# Patient Record
Sex: Male | Born: 1955
Health system: Southern US, Community
[De-identification: ages and names within clinical notes are randomized; demographics above are authoritative.]

## PROBLEM LIST (undated history)

## (undated) DIAGNOSIS — C801 Malignant (primary) neoplasm, unspecified: Secondary | ICD-10-CM

## (undated) DIAGNOSIS — R112 Nausea with vomiting, unspecified: Secondary | ICD-10-CM

## (undated) DIAGNOSIS — I10 Essential (primary) hypertension: Secondary | ICD-10-CM

## (undated) DIAGNOSIS — M5412 Radiculopathy, cervical region: Secondary | ICD-10-CM

## (undated) DIAGNOSIS — Z9889 Other specified postprocedural states: Secondary | ICD-10-CM

## (undated) HISTORY — DX: Radiculopathy, cervical region: M54.12

## (undated) HISTORY — PX: BACK SURGERY: SHX140

## (undated) HISTORY — PX: WRIST SURGERY: SHX841

## (undated) HISTORY — DX: Essential (primary) hypertension: I10

## (undated) HISTORY — PX: HYDROCELE EXCISION: SHX482

---
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
## 2005-03-14 HISTORY — PX: COLONOSCOPY: SHX5424

## 2006-04-14 HISTORY — PX: PROSTATECTOMY: SHX69

## 2013-04-16 ENCOUNTER — Encounter (HOSPITAL_COMMUNITY): Payer: Self-pay | Admitting: Emergency Medicine

## 2013-04-16 ENCOUNTER — Emergency Department (HOSPITAL_COMMUNITY)
Admission: EM | Admit: 2013-04-16 | Discharge: 2013-04-16 | Disposition: A | Discharge status: Home / Self Care | Payer: Medicare Other | Attending: Emergency Medicine | Admitting: Emergency Medicine

## 2013-04-16 ENCOUNTER — Emergency Department (HOSPITAL_COMMUNITY): Payer: Medicare Other

## 2013-04-16 DIAGNOSIS — M5430 Sciatica, unspecified side: Secondary | ICD-10-CM | POA: Insufficient documentation

## 2013-04-16 DIAGNOSIS — Z8546 Personal history of malignant neoplasm of prostate: Secondary | ICD-10-CM | POA: Insufficient documentation

## 2013-04-16 DIAGNOSIS — M5432 Sciatica, left side: Secondary | ICD-10-CM

## 2013-04-16 HISTORY — DX: Malignant (primary) neoplasm, unspecified: C80.1

## 2013-04-16 MED ORDER — PREDNISONE 10 MG PO TABS: ORAL_TABLET | ORAL | Status: DC

## 2013-04-16 MED ORDER — METHOCARBAMOL 500 MG PO TABS: 500.0000 mg | ORAL_TABLET | Freq: Two times a day (BID) | ORAL | Status: DC

## 2013-04-16 MED ORDER — IBUPROFEN 800 MG PO TABS: 800.0000 mg | ORAL_TABLET | Freq: Three times a day (TID) | ORAL | Status: DC

## 2013-04-16 NOTE — ED Notes (Signed)
Pt c/o left side back pain that starts in left buttock area and radiates down left leg at times, reports that pain started after he was working at his wood pile and jumped across a ditch, denies any problems with urination or bowel movements,

## 2013-04-16 NOTE — ED Notes (Signed)
Pt c/o left hip/buttox pain radiating down leg since chopping/lifting wood on Monday

## 2013-04-16 NOTE — ED Notes (Signed)
Santiago Glad PA at bedside speaking with pt and family

## 2013-04-16 NOTE — ED Provider Notes (Signed)
CSN: [id number]     Arrival date & time 04/16/13  1007 History   First MD Initiated Contact with Patient 04/16/13 1009     Chief Complaint  Patient presents with  . Hip Pain   (Consider location/radiation/quality/duration/timing/severity/associated sxs/prior Treatment) Patient is a 57 y.o. male presenting with back pain. The history is provided by the patient. No language interpreter was used.  Back Pain Location:  Lumbar spine Quality:  Aching Radiates to:  Does not radiate Pain severity:  Moderate Pain is:  Same all the time Duration:  2 days Timing:  Constant Progression:  Worsening Chronicity:  New Relieved by:  Nothing Worsened by:  Nothing tried Ineffective treatments:  None tried Associated symptoms: no abdominal pain     Past Medical History  Diagnosis Date  . Cancer     prostate   Past Surgical History  Procedure Laterality Date  . Back surgery    . Wrist surgery     No family history on file. History  Substance Use Topics  . Smoking status: Never Smoker   . Smokeless tobacco: Not on file  . Alcohol Use: Yes     Comment: occasional    Review of Systems  Gastrointestinal: Negative for abdominal pain.  Musculoskeletal: Positive for back pain.  All other systems reviewed and are negative.    Allergies  Review of patient's allergies indicates no known allergies.  Home Medications  No current outpatient prescriptions on file. BP 153/89  Pulse 58  Temp(Src) 98 F (36.7 C) (Oral)  Resp 16  Ht 5\' 11"  (1.803 m)  Wt 160 lb (72.576 kg)  BMI 22.33 kg/m2  SpO2 100% Physical Exam  Nursing note and vitals reviewed. Constitutional: He is oriented to person, place, and time. He appears well-developed and well-nourished.  HENT:  Head: Normocephalic.  Eyes: EOM are normal. Pupils are equal, round, and reactive to light.  Neck: Normal range of motion.  Cardiovascular: Normal rate.   Pulmonary/Chest: Effort normal.  Abdominal: Soft. He exhibits no  distension.  Musculoskeletal: Normal range of motion. He exhibits tenderness.  Tender sciatic notch  Neurological: He is alert and oriented to person, place, and time.  Skin: Skin is warm.  Psychiatric: He has a normal mood and affect.    ED Course  Procedures (including critical care time) Labs Review Labs Reviewed - No data to display Imaging Review No results found.  EKG Interpretation   None       MDM   1. Sciatica, left    Prednisone taper,  Robaxin, ibuprofen.   Follow up with Dr. Aline Brochure for evaluation of back pain   Fransico Meadow, PA-C 04/16/13 1127

## 2013-04-16 NOTE — Discharge Instructions (Signed)
Back Pain, Adult Back pain is very common. The pain often gets better over time. The cause of back pain is usually not dangerous. Most people can learn to manage their back pain on their own.  HOME CARE   Stay active. Start with short walks on flat ground if you can. Try to walk farther each day.  Do not sit, drive, or stand in one place for more than 30 minutes. Do not stay in bed.  Do not avoid exercise or work. Activity can help your back heal faster.  Be careful when you bend or lift an object. Bend at your knees, keep the object close to you, and do not twist.  Sleep on a firm mattress. Lie on your side, and bend your knees. If you lie on your back, put a pillow under your knees.  Only take medicines as told by your doctor.  Put ice on the injured area.  Put ice in a plastic bag.  Place a towel between your skin and the bag.  Leave the ice on for 15-20 minutes, 03-04 times a day for the first 2 to 3 days. After that, you can switch between ice and heat packs.  Ask your doctor about back exercises or massage.  Avoid feeling anxious or stressed. Find good ways to deal with stress, such as exercise. GET HELP RIGHT AWAY IF:   Your pain does not go away with rest or medicine.  Your pain does not go away in 1 week.  You have new problems.  You do not feel well.  The pain spreads into your legs.  You cannot control when you poop (bowel movement) or pee (urinate).  Your arms or legs feel weak or lose feeling (numbness).  You feel sick to your stomach (nauseous) or throw up (vomit).  You have belly (abdominal) pain.  You feel like you may pass out (faint). MAKE SURE YOU:   Understand these instructions.  Will watch your condition.  Will get help right away if you are not doing well or get worse. Document Released: 09/17/2007 Document Revised: 06/23/2011 Document Reviewed: 08/19/2010 [facility] Patient Information 2014 [place]. Sciatica Sciatica is pain,  weakness, numbness, or tingling along the path of the sciatic nerve. The nerve starts in the lower back and runs down the back of each leg. The nerve controls the muscles in the lower leg and in the back of the knee, while also providing sensation to the back of the thigh, lower leg, and the sole of your foot. Sciatica is a symptom of another medical condition. For instance, nerve damage or certain conditions, such as a herniated disk or bone spur on the spine, pinch or put pressure on the sciatic nerve. This causes the pain, weakness, or other sensations normally associated with sciatica. Generally, sciatica only affects one side of the body. CAUSES   Herniated or slipped disc.  Degenerative disk disease.  A pain disorder involving the narrow muscle in the buttocks (piriformis syndrome).  Pelvic injury or fracture.  Pregnancy.  Tumor (rare). SYMPTOMS  Symptoms can vary from mild to very severe. The symptoms usually travel from the low back to the buttocks and down the back of the leg. Symptoms can include:  Mild tingling or dull aches in the lower back, leg, or hip.  Numbness in the back of the calf or sole of the foot.  Burning sensations in the lower back, leg, or hip.  Sharp pains in the lower back, leg, or hip.  Leg weakness.  Severe back pain inhibiting movement. These symptoms may get worse with coughing, sneezing, laughing, or prolonged sitting or standing. Also, being overweight may worsen symptoms. DIAGNOSIS  Your caregiver will perform a physical exam to look for common symptoms of sciatica. He or she may ask you to do certain movements or activities that would trigger sciatic nerve pain. Other tests may be performed to find the cause of the sciatica. These may include:  Blood tests.  X-rays.  Imaging tests, such as an MRI or CT scan. TREATMENT  Treatment is directed at the cause of the sciatic pain. Sometimes, treatment is not necessary and the pain and discomfort  goes away on its own. If treatment is needed, your caregiver may suggest:  Over-the-counter medicines to relieve pain.  Prescription medicines, such as anti-inflammatory medicine, muscle relaxants, or narcotics.  Applying heat or ice to the painful area.  Steroid injections to lessen pain, irritation, and inflammation around the nerve.  Reducing activity during periods of pain.  Exercising and stretching to strengthen your abdomen and improve flexibility of your spine. Your caregiver may suggest losing weight if the extra weight makes the back pain worse.  Physical therapy.  Surgery to eliminate what is pressing or pinching the nerve, such as a bone spur or part of a herniated disk. HOME CARE INSTRUCTIONS   Only take over-the-counter or prescription medicines for pain or discomfort as directed by your caregiver.  Apply ice to the affected area for 20 minutes, 3 4 times a day for the first 48 72 hours. Then try heat in the same way.  Exercise, stretch, or perform your usual activities if these do not aggravate your pain.  Attend physical therapy sessions as directed by your caregiver.  Keep all follow-up appointments as directed by your caregiver.  Do not wear high heels or shoes that do not provide proper support.  Check your mattress to see if it is too soft. A firm mattress may lessen your pain and discomfort. SEEK IMMEDIATE MEDICAL CARE IF:   You lose control of your bowel or bladder (incontinence).  You have increasing weakness in the lower back, pelvis, buttocks, or legs.  You have redness or swelling of your back.  You have a burning sensation when you urinate.  You have pain that gets worse when you lie down or awakens you at night.  Your pain is worse than you have experienced in the past.  Your pain is lasting longer than 4 weeks.  You are suddenly losing weight without reason. MAKE SURE YOU:  Understand these instructions.  Will watch your  condition.  Will get help right away if you are not doing well or get worse. Document Released: 03/25/2001 Document Revised: 09/30/2011 Document Reviewed: 08/10/2011 [facility] Patient Information 2014 [place].

## 2013-04-16 NOTE — ED Provider Notes (Signed)
Medical screening examination/treatment/procedure(s) were performed by non-physician practitioner and as supervising physician I was immediately available for consultation/collaboration.  Richarda Blade, MD 04/16/13 [phone]

## 2013-04-19 ENCOUNTER — Telehealth: Payer: Self-pay | Admitting: Orthopedic Surgery

## 2013-04-19 NOTE — Telephone Encounter (Signed)
04/19/13 called back to patient, reached, relayed, then lost connection in midst of scheduling appointment. Trying back.

## 2013-04-19 NOTE — Telephone Encounter (Signed)
Call received from patient, requests appointment following [facility] visit for problem, sciatica-chart visit at Emergency Room indicates referral to our office;  had been seen there 04/16/13, had Xrays, and was prescribed steroid and muscle relaxer, which states is helping a little.  States has history of back surgery, performed in [place].  Please review and advise. Ph# [phone]. (Patient's insurance is [employer])

## 2013-04-19 NOTE — Telephone Encounter (Signed)
6 weeks if not better

## 2013-04-21 NOTE — Telephone Encounter (Signed)
Called back to patient to follow up; states he has appointment scheduled with primary care on 05/06/13, and will "go from there" .  Elects to hold at this time on scheduling a 6-week appointment here.

## 2013-05-06 ENCOUNTER — Encounter: Payer: Self-pay | Admitting: Family Medicine

## 2013-05-06 ENCOUNTER — Ambulatory Visit (INDEPENDENT_AMBULATORY_CARE_PROVIDER_SITE_OTHER): Payer: Medicare Other | Admitting: Family Medicine

## 2013-05-06 VITALS — BP 126/84 | HR 60 | Temp 97.6°F | Resp 20 | Ht 69.5 in | Wt 153.4 lb

## 2013-05-06 DIAGNOSIS — H9190 Unspecified hearing loss, unspecified ear: Secondary | ICD-10-CM

## 2013-05-06 DIAGNOSIS — M1990 Unspecified osteoarthritis, unspecified site: Secondary | ICD-10-CM

## 2013-05-06 DIAGNOSIS — C801 Malignant (primary) neoplasm, unspecified: Secondary | ICD-10-CM

## 2013-05-06 DIAGNOSIS — Z0000 Encounter for general adult medical examination without abnormal findings: Secondary | ICD-10-CM

## 2013-05-06 DIAGNOSIS — Z7289 Other problems related to lifestyle: Secondary | ICD-10-CM

## 2013-05-06 DIAGNOSIS — Z789 Other specified health status: Secondary | ICD-10-CM

## 2013-05-06 DIAGNOSIS — R110 Nausea: Secondary | ICD-10-CM

## 2013-05-06 DIAGNOSIS — N529 Male erectile dysfunction, unspecified: Secondary | ICD-10-CM

## 2013-05-06 DIAGNOSIS — F1290 Cannabis use, unspecified, uncomplicated: Secondary | ICD-10-CM

## 2013-05-06 DIAGNOSIS — M5412 Radiculopathy, cervical region: Secondary | ICD-10-CM

## 2013-05-06 DIAGNOSIS — R634 Abnormal weight loss: Secondary | ICD-10-CM

## 2013-05-06 DIAGNOSIS — F1210 Cannabis abuse, uncomplicated: Secondary | ICD-10-CM

## 2013-05-06 DIAGNOSIS — Z23 Encounter for immunization: Secondary | ICD-10-CM

## 2013-05-06 LAB — POCT URINALYSIS DIPSTICK
Bilirubin, UA: 6
Blood, UA: NEGATIVE
GLUCOSE UA: NEGATIVE
Leukocytes, UA: NEGATIVE
Nitrite, UA: NEGATIVE
Urobilinogen, UA: NEGATIVE
pH, UA: 6

## 2013-05-06 NOTE — Progress Notes (Signed)
   Subjective:    Patient ID: Edward Rhodes, male    DOB: [birth year], 58 y.o.   MRN: [id number]  HPI Pt here to establish care. Recently moved from [place] with his wife. Are now closer to grandchild.  Sadly lost son last April to narcotic addiction - extremely distressing to pt.   Reviewed records. Fairly healthy gentleman, retired Therapist, art. Likes to stay busy. No specific concerns today.  H/o cervical radiculopathy was seeing ortho in [place]. Would like referral to continue seeing ortho.   Heath maint: Has not established yet with dentist or optometry - would like refereals. Due for colonoscopy. - has not had in the past. Has not had tdap. Used to smoke tobacco and currently smokes MJ. Discussed risk for AAA. Does not drink much milk but does stay outdoors gardening and sliptting Edward Rhodes a lot. Has not had D checked recently.  Born in [place], has not checked hep C yet.  Not sure when last lipids checked.  BP at goal.   Has problem with ED - attracted to wife and enjoys sexual intimacy but difficulty achieving erection. Not on nitrates.     Review of Systems A 12 point review of systems is negative except as per hpi.       Objective:   Physical Exam  Nursing note and vitals reviewed. Constitutional: He is oriented to person, place, and time. He  appears well-developed and well-nourished.  HENT:  Right Ear: External ear normal.  Left Ear: External ear normal.  Nose: Nose normal.  Mouth/Throat: Oropharynx is clear and moist. No oropharyngeal exudate.  Eyes: Conjunctivae are normal. Pupils are equal, round, and reactive to light.  Neck: Normal range of motion. Neck supple. No thyromegaly present.  Cardiovascular: Normal rate, regular rhythm and normal heart sounds.   Pulmonary/Chest: Effort normal and breath sounds normal.  Abdominal: Soft. Bowel sounds are normal.  no distension. There is no tenderness. There is no rebound.  Lymphadenopathy:    He has no cervical adenopathy.    Neurological: He is alert and oriented to person, place, and time. He has normal reflexes.  Skin: Skin is warm and dry.He has no concerning moles or skin lesions Psychiatric: He has a normal mood and affect. His behavior is normal. Tearful when talkoing of son       Assessment & Plan:  Edward Rhodes was seen today for new patient.  Diagnoses and associated orders for this visit:  Preventative health care - Ambulatory referral to Dentistry - Ambulatory referral to Optometry - Ambulatory referral to Gastroenterology - Tdap vaccine greater than or equal to 7yo IM - US Aorta; Future - Vit D  25 hydroxy (rtn osteoporosis monitoring); Future - POCT urinalysis dipstick - Lipid Panel; Future - PSA; Future - Hepatitis C antibody; Future  Cervical radiculopathy - Ambulatory referral to Orthopedic Surgery  Cancer - Ambulatory referral to Urology - PSA; Future  Marijuana smoker - advised to cut down Osteoarthritis  Alcohol consumption of one to four drinks per day  Decreased hearing - Ambulatory referral to Audiology  Nausea alone  Loss of weight - Comprehensive metabolic panel; Future - CBC with Differential; Future - TSH; Future  Erectile dysfunction - Testosterone; Future - sample cialis given  rtc 2 weeks review labs and f/u

## 2013-05-06 NOTE — Patient Instructions (Signed)

## 2013-05-10 ENCOUNTER — Other Ambulatory Visit: Payer: Self-pay | Admitting: Family Medicine

## 2013-05-10 LAB — COMPREHENSIVE METABOLIC PANEL
ALBUMIN: 4.4 g/dL (ref 3.5–5.2)
ALT: 8 U/L (ref 0–53)
AST: 14 U/L (ref 0–37)
Alkaline Phosphatase: 46 U/L (ref 39–117)
BUN: 18 mg/dL (ref 6–23)
CHLORIDE: 102 meq/L (ref 96–112)
CO2: 29 meq/L (ref 19–32)
CREATININE: 0.91 mg/dL (ref 0.50–1.35)
Calcium: 9.5 mg/dL (ref 8.4–10.5)
Glucose, Bld: 102 mg/dL — ABNORMAL HIGH (ref 70–99)
POTASSIUM: 4.9 meq/L (ref 3.5–5.3)
Sodium: 137 mEq/L (ref 135–145)
TOTAL PROTEIN: 6.4 g/dL (ref 6.0–8.3)
Total Bilirubin: 0.6 mg/dL (ref 0.3–1.2)

## 2013-05-10 LAB — CBC WITH DIFFERENTIAL/PLATELET
Basophils Absolute: 0 10*3/uL (ref 0.0–0.1)
Basophils Relative: 0 % (ref 0–1)
Eosinophils Absolute: 0.2 10*3/uL (ref 0.0–0.7)
Eosinophils Relative: 4 % (ref 0–5)
HCT: 44.5 % (ref 39.0–52.0)
Hemoglobin: 15.1 g/dL (ref 13.0–17.0)
LYMPHS ABS: 1.3 10*3/uL (ref 0.7–4.0)
LYMPHS PCT: 27 % (ref 12–46)
MCH: 32.4 pg (ref 26.0–34.0)
MCHC: 33.9 g/dL (ref 30.0–36.0)
MCV: 95.5 fL (ref 78.0–100.0)
Monocytes Absolute: 0.5 10*3/uL (ref 0.1–1.0)
Monocytes Relative: 10 % (ref 3–12)
NEUTROS PCT: 59 % (ref 43–77)
Neutro Abs: 2.8 10*3/uL (ref 1.7–7.7)
Platelets: 200 10*3/uL (ref 150–400)
RBC: 4.66 MIL/uL (ref 4.22–5.81)
RDW: 13.1 % (ref 11.5–15.5)
WBC: 4.7 10*3/uL (ref 4.0–10.5)

## 2013-05-10 LAB — LIPID PANEL
CHOL/HDL RATIO: 3.7 ratio
Cholesterol: 182 mg/dL (ref 0–200)
HDL: 49 mg/dL (ref >39)
LDL Cholesterol: 117 mg/dL — ABNORMAL HIGH (ref 0–99)
Triglycerides: 81 mg/dL (ref <150)
VLDL: 16 mg/dL (ref 0–40)

## 2013-05-10 LAB — TSH: TSH: 1.301 u[IU]/mL (ref 0.350–4.500)

## 2013-05-11 LAB — PSA: PSA: <0.01 ng/mL (ref <=4.00)

## 2013-05-11 LAB — VITAMIN D 25 HYDROXY (VIT D DEFICIENCY, FRACTURES): Vit D, 25-Hydroxy: 33 ng/mL (ref 30–89)

## 2013-05-11 LAB — TESTOSTERONE: Testosterone: 563 ng/dL (ref 300–890)

## 2013-05-11 LAB — HEPATITIS C ANTIBODY: HCV AB: NEGATIVE

## 2013-05-26 ENCOUNTER — Ambulatory Visit (HOSPITAL_COMMUNITY)
Admission: RE | Admit: 2013-05-26 | Discharge: 2013-05-26 | Disposition: A | Discharge status: Home / Self Care | Payer: Medicare Other | Source: Ambulatory Visit | Attending: Family Medicine | Admitting: Family Medicine

## 2013-05-26 DIAGNOSIS — Z1389 Encounter for screening for other disorder: Secondary | ICD-10-CM | POA: Insufficient documentation

## 2013-05-26 DIAGNOSIS — I77819 Aortic ectasia, unspecified site: Secondary | ICD-10-CM | POA: Insufficient documentation

## 2013-05-26 DIAGNOSIS — Z0000 Encounter for general adult medical examination without abnormal findings: Secondary | ICD-10-CM

## 2013-05-30 ENCOUNTER — Telehealth: Payer: Self-pay | Admitting: *Deleted

## 2013-05-30 NOTE — Progress Notes (Signed)
See telephone encounters

## 2013-05-30 NOTE — Telephone Encounter (Signed)
Pt called, no answer, message left for callback

## 2013-05-30 NOTE — Telephone Encounter (Signed)
Message copied by [facility], Louis Meckel on Mon May 30, 2013 11:23 AM ------      Message from: Doran Heater      Created: Fri May 27, 2013  8:33 AM       Please let pt know that he is at risk for developing AAA. He should get a repeat US in 5 years. Thanks AW ------

## 2013-05-30 NOTE — Telephone Encounter (Signed)
Pt returned call and notified to have US done again in 5 years. Pt appreciative and understanding.

## 2013-06-06 ENCOUNTER — Ambulatory Visit (INDEPENDENT_AMBULATORY_CARE_PROVIDER_SITE_OTHER): Payer: Medicare Other | Admitting: Family Medicine

## 2013-06-06 ENCOUNTER — Encounter: Payer: Self-pay | Admitting: Family Medicine

## 2013-06-06 VITALS — BP 130/88 | HR 60 | Temp 98.0°F | Resp 18 | Ht 69.5 in | Wt 159.0 lb

## 2013-06-06 DIAGNOSIS — F1290 Cannabis use, unspecified, uncomplicated: Secondary | ICD-10-CM

## 2013-06-06 DIAGNOSIS — Z789 Other specified health status: Secondary | ICD-10-CM

## 2013-06-06 DIAGNOSIS — F1210 Cannabis abuse, uncomplicated: Secondary | ICD-10-CM

## 2013-06-06 DIAGNOSIS — N529 Male erectile dysfunction, unspecified: Secondary | ICD-10-CM

## 2013-06-06 DIAGNOSIS — R634 Abnormal weight loss: Secondary | ICD-10-CM

## 2013-06-06 DIAGNOSIS — I77811 Abdominal aortic ectasia: Secondary | ICD-10-CM

## 2013-06-06 DIAGNOSIS — Z7289 Other problems related to lifestyle: Secondary | ICD-10-CM

## 2013-06-06 DIAGNOSIS — F329 Major depressive disorder, single episode, unspecified: Secondary | ICD-10-CM

## 2013-06-06 DIAGNOSIS — F4329 Adjustment disorder with other symptoms: Secondary | ICD-10-CM | POA: Insufficient documentation

## 2013-06-06 DIAGNOSIS — F4381 Prolonged grief disorder: Secondary | ICD-10-CM

## 2013-06-06 MED ORDER — TADALAFIL 10 MG PO TABS: 10.0000 mg | ORAL_TABLET | Freq: Every day | ORAL | Status: DC | PRN

## 2013-06-06 NOTE — Progress Notes (Signed)
   Subjective:    Patient ID: Edward Rhodes, male    DOB: Apr 11, 1956, 58 y.o.   MRN: [id number]  HPI Pt here to f/u.   CPE last visit - labs all look good. AAA screen shows: Ectatic proximal abdominal aorta at risk for aneurysm development.  Recommend follow up by [place] in 5 years. Referrals have been placed but not yet scheduled.   nusea has resolved  Weight loss has stabilized - likley related to nausea.   ED much improved with cialis.   He continues to smoke MJ daily. Drinks much less than in the past - 1 glass wine a few times weekly. Lost his son to narcotics last April - still absolutely devastated. Has not sought counselling and declined meds. Sees as sign of weakness. Says is very in love with his wife but is much more frustrated with her over the past months. She has gained weight due to depression whereas he has been on the go nonstop.    Review of Systems A 12 point review of systems is negative except as per hpi.       Objective:   Physical Exam Nursing note and vitals reviewed. Constitutional: He is oriented to person, place, and time. He  appears well-developed and well-nourished.  HENT:  Right Ear: External ear normal.  Left Ear: External ear normal.  Nose: Nose normal.  Mouth/Throat: Oropharynx is clear and moist. No oropharyngeal exudate.  Eyes: Conjunctivae are normal. Pupils are equal, round, and reactive to light.  Neck: Normal range of motion. Neck supple. No thyromegaly present.  Cardiovascular: Normal rate, regular rhythm and normal heart sounds.   Pulmonary/Chest: Effort normal and breath sounds normal.  Abdominal: Soft. Bowel sounds are normal.  no distension. There is no tenderness. There is no rebound.  Lymphadenopathy:    He has no cervical adenopathy.  Neurological: He is alert and oriented to person, place, and time. He has normal reflexes.  Skin: Skin is warm and dry.He has no concerning moles or skin lesions Psychiatric: He has a normal mood and  affect. His behavior is normal.        Assessment & Plan:  [place] was seen today for follow-up.  Diagnoses and associated orders for this visit:  Loss of weight - will keep an eye - stable for now Marijuana smoker - agrees to decrease to weekend use only. Will not watch grandchild or drive under the influence.  Alcohol consumption of one to four drinks per day Would like him to stop altogether, but 3 glasses/week is big iprovement - do not increase.  Prolonged grief reaction - Ambulatory referral to Psychology - he and wife have agreed to couples counselling.  - if feels things are getting out of hand, or would like to try meds, woiuld consider welbutrin. Pt very concerned about sexual side effects.  - no si/hi now, will go to ED if develop. Erectile dysfunction - tadalafil (CIALIS) 10 MG tablet; Take 1 tablet (10 mg total) by mouth daily as needed for erectile dysfunction. - continue prn abd aortic ectasia - f/u u/s 5 yrs, earlier if any sx. F/u 3-6 mos, earlir if needed. Call with any questions or concerns.

## 2013-06-06 NOTE — Patient Instructions (Signed)
You do not have a AAA now, but this is what we are watching for because you are at significantly increased risk.   Abdominal Aortic Aneurysm An aneurysm is a weakened or damaged part of an artery wall that bulges from the normal force of blood pumping through the body. An abdominal aortic aneurysm is an aneurysm that occurs in the lower part of the aorta, the main artery of the body.  The major concern with an abdominal aortic aneurysm is that it can enlarge and burst (rupture) or blood can flow between the layers of the wall of the aorta through a tear (aorticdissection). Both of these conditions can cause bleeding inside the body and can be life threatening unless diagnosed and treated promptly. CAUSES  The exact cause of an abdominal aortic aneurysm is unknown. Some contributing factors are:   A hardening of the arteries caused by the buildup of fat and other substances in the lining of a blood vessel (arteriosclerosis).  Inflammation of the walls of an artery (arteritis).   Connective tissue diseases, such as Marfan syndrome.   Abdominal trauma.   An infection, such as syphilis or staphylococcus, in the wall of the aorta (infectious aortitis) caused by bacteria. RISK FACTORS  Risk factors that contribute to an abdominal aortic aneurysm may include:  Age older than 50 years.   High blood pressure (hypertension).  Male gender.  Ethnicity (white race).  Obesity.  Family history of aneurysm (first degree relatives only).  Tobacco use. PREVENTION  The following healthy lifestyle habits may help decrease your risk of abdominal aortic aneurysm:  Quitting smoking. Smoking can raise your blood pressure and cause arteriosclerosis.  Limiting or avoiding alcohol.  Keeping your blood pressure, blood sugar level, and cholesterol levels within normal limits.  Decreasing your salt intake. In somepeople, too much salt can raise blood pressure and increase your risk of abdominal  aortic aneurysm.  Eating a diet low in saturated fats and cholesterol.  Increasing your fiber intake by including whole grains, vegetables, and fruits in your diet. Eating these foods may help lower blood pressure.  Maintaining a healthy weight.  Staying physically active and exercising regularly. SYMPTOMS  The symptoms of abdominal aortic aneurysm may vary depending on the size and rate of growth of the aneurysm.Most grow slowly and do not have any symptoms. When symptoms do occur, they may include:  Pain (abdomen, side, lower back, or groin). The pain may vary in intensity. A sudden onset of severe pain may indicate that the aneurysm has ruptured.  Feeling full after eating only small amounts of food.  Nausea or vomiting or both.  Feeling a pulsating lump in the abdomen.  Feeling faint or passing out. DIAGNOSIS  Since most unruptured abdominal aortic aneurysms have no symptoms, they are often discovered during diagnostic exams for other conditions. An aneurysm may be found during the following procedures:  Ultrasonography (A one-time screening for abdominal aortic aneurysm by ultrasonography is also recommended for all men aged 66-75 years who have ever smoked).  X-ray exams.  A computed tomography (CT).  Magnetic resonance imaging (MRI).  Angiography or arteriography. TREATMENT  Treatment of an abdominal aortic aneurysm depends on the size of your aneurysm, your age, and risk factors for rupture. Medication to control blood pressure and pain may be used to manage aneurysms smaller than 6 cm. Regular monitoring for enlargement may be recommended by your caregiver if:  The aneurysm is 3 4 cm in size (an annual ultrasonography may be recommended).  The aneurysm is 4 4.5 cm in size (an ultrasonography every 6 months may be recommended).  The aneurysm is larger than 4.5 cm in size (your caregiver may ask that you be examined by a vascular surgeon). If your aneurysm is larger  than 6 cm, surgical repair may be recommended. There are two main methods for repair of an aneurysm:   Endovascular repair (a minimally invasive surgery). This is done most often.  Open repair. This method is used if an endovascular repair is not possible. Document Released: 01/08/2005 Document Revised: 07/26/2012 Document Reviewed: 04/30/2012 [facility] Patient Information 2014 [place], [place].

## 2013-06-06 NOTE — Assessment & Plan Note (Signed)
Needs u/s in 2020, earlier if sx develop

## 2013-06-08 ENCOUNTER — Encounter: Payer: Self-pay | Admitting: Family Medicine

## 2013-06-29 ENCOUNTER — Telehealth: Payer: Self-pay | Admitting: *Deleted

## 2013-06-29 NOTE — Telephone Encounter (Signed)
Wife in office and requested print out of lab results from last visit. Labs printed and given to wife

## 2013-08-17 ENCOUNTER — Ambulatory Visit (INDEPENDENT_AMBULATORY_CARE_PROVIDER_SITE_OTHER): Payer: Medicare Other | Admitting: Gastroenterology

## 2013-08-17 ENCOUNTER — Encounter (INDEPENDENT_AMBULATORY_CARE_PROVIDER_SITE_OTHER): Payer: Self-pay

## 2013-08-17 ENCOUNTER — Encounter: Payer: Self-pay | Admitting: Gastroenterology

## 2013-08-17 VITALS — BP 135/82 | HR 53 | Temp 97.6°F | Ht 68.0 in | Wt 158.2 lb

## 2013-08-17 DIAGNOSIS — Z1211 Encounter for screening for malignant neoplasm of colon: Secondary | ICD-10-CM

## 2013-08-17 NOTE — Progress Notes (Signed)
Referring Provider: No ref. provider found Primary Care Physician:  Doran Heater, MD Primary Gastroenterologist:  Dr. Oneida Alar   Chief Complaint  Patient presents with  . Colonoscopy    HPI:   Edward Rhodes presents today to set up a routine screening colonoscopy. Original colonoscopy about 7-8 years ago in [place] per patient. No rectal bleeding. No changes in bowel habits. Like clockwork every morning. Denies abdominal pain. No unexplained weight loss or lack of appetite. No reflux, dysphagia.   Son passed away about a year ago. Moved down to be with daughter and new grandbaby, who is turning a year old May 13.   Past Medical History  Diagnosis Date  . Cancer     prostate  . Cervical radiculopathy     Past Surgical History  Procedure Laterality Date  . Back surgery    . Wrist surgery    . Prostatectomy  07/28/06  . Hydrocele excision    . Colonoscopy      up state [place]    No current outpatient prescriptions on file.   No current facility-administered medications for this visit.    Allergies as of 08/17/2013 - Review Complete 08/17/2013  Allergen Reaction Noted  . Aleve [naproxen sodium]  04/16/2013    Family History  Problem Relation Age of Onset  . Heart disease Father   . Dementia Father   . Prostate cancer Father   . Drug abuse Son   . Dementia Mother   . Breast cancer Sister   . Colon cancer Neg Hx     History   Social History  . Marital Status: Married    Spouse Name: N/A    Number of Children: N/A  . Years of Education: N/A   Occupational History  . Not on file.   Social History Main Topics  . Smoking status: Never Smoker   . Smokeless tobacco: Not on file  . Alcohol Use: Yes     Comment: 2-3 glasses wine once per week, h/o heavy drinking 25 years ago  . Drug Use: Yes    Special: Marijuana     Comment: couple times a week   . Sexual Activity: Not on file   Other Topics Concern  . Not on file   Social History  Narrative   Son passed away 07-27-2012 from narcotic addiction. Started when was bitten by snake and required narcotic pain meds.     Review of Systems: As mentioned in HPI.   Physical Exam: BP 135/82  Pulse 53  Temp(Src) 97.6 F (36.4 C) (Oral)  Ht 5\' 8"  (1.727 m)  Wt 158 lb 3.2 oz (71.759 kg)  BMI 24.06 kg/m2 General:   Alert and oriented. Well-developed, well-nourished, pleasant and cooperative. Head:  Normocephalic and atraumatic. Eyes:  Conjunctiva pink, sclera clear, no icterus.   Conjunctiva pink. Ears:  Normal auditory acuity. Nose:  No deformity, discharge,  or lesions. Mouth:  No deformity or lesions, mucosa pink and moist.  Lungs:  Clear to auscultation bilaterally, without wheezing, rales, or rhonchi.  Heart:  S1, S2 present without murmurs noted.  Abdomen:  +BS, soft, non-tender and non-distended. Without mass or HSM. No rebound or guarding. No hernias noted. Rectal:  Deferred  Msk:  Symmetrical without gross deformities. Normal posture. Extremities:  Without clubbing or edema. Neurologic:  Alert and  oriented x4;  grossly normal neurologically. Skin:  Intact, warm and dry without significant lesions or rashes Psych:  Alert and cooperative.  Normal mood and affect.  Lab Results  Component Value Date   WBC 4.7 05/10/2013   HGB 15.1 05/10/2013   HCT 44.5 05/10/2013   MCV 95.5 05/10/2013   PLT 200 05/10/2013   Lab Results  Component Value Date   ALT 8 05/10/2013   AST 14 05/10/2013   ALKPHOS 46 05/10/2013   BILITOT 0.6 05/10/2013

## 2013-08-17 NOTE — Patient Instructions (Signed)
Please complete the stool sample and return to our office. If it is positive, we will definitely proceed with a colonoscopy.  However, if it is negative, and you are not due for another colonoscopy, we will wait until it has been 10 years since your last procedure.  Further recommendations to follow!

## 2013-08-17 NOTE — Assessment & Plan Note (Signed)
58 year old male with last colonoscopy in remote past, unsure date. No concerning lower GI symptoms, rectal bleeding, change in bowel habits. Will obtain outside records from [place]; if it has been 10 years, he will need a routine screening. I have also asked him to complete an ifobt; if this is positive, will proceed with colonoscopy regardless of time lapse. Will need Propofol for sedation due to history of ETOH use and marijuana.

## 2013-08-18 NOTE — Progress Notes (Signed)
No PCP 

## 2013-08-19 ENCOUNTER — Encounter: Payer: Self-pay | Admitting: Gastroenterology

## 2013-08-19 NOTE — Progress Notes (Signed)
Received records from [place]. Patient's last colonoscopy was in Dec 2006 WITHOUT evidence of polyps. 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   Without a family history of colon cancer or polyps, he will be due for routine screening in 2016.   However, he has been asked to complete an ifobt for [place]. IF POSITIVE, we will proceed with colonoscopy now. IF NEGATIVE, colonoscopy next year. Please let patient know.

## 2013-09-07 NOTE — Progress Notes (Signed)
Reminder in epic °

## 2013-09-07 NOTE — Progress Notes (Signed)
Noted. Please make sure he is on recall list for 2016.

## 2013-09-07 NOTE — Progress Notes (Signed)
Will forward to Manuela Schwartz to nic the next colonoscopy.

## 2013-09-07 NOTE — Progress Notes (Signed)
Called and told pt. He said he was wrong when he told the doctor that he had blood in his stool, that his wife said that it was in his urine and he has been seen and taken care of.  He does not want to do the iFOBT now and said he just wants to wait til 2016 when he is next due for the colonoscopy.

## 2014-09-13 ENCOUNTER — Encounter: Payer: Self-pay | Admitting: Gastroenterology

## 2014-09-13 ENCOUNTER — Encounter (INDEPENDENT_AMBULATORY_CARE_PROVIDER_SITE_OTHER): Payer: Self-pay

## 2014-09-13 ENCOUNTER — Other Ambulatory Visit: Payer: Self-pay

## 2014-09-13 ENCOUNTER — Ambulatory Visit (INDEPENDENT_AMBULATORY_CARE_PROVIDER_SITE_OTHER): Payer: Medicare Other | Admitting: Gastroenterology

## 2014-09-13 VITALS — BP 147/86 | HR 56 | Temp 97.6°F | Ht 70.0 in | Wt 156.6 lb

## 2014-09-13 DIAGNOSIS — Z1211 Encounter for screening for malignant neoplasm of colon: Secondary | ICD-10-CM

## 2014-09-13 MED ORDER — NA SULFATE-K SULFATE-MG SULF 17.5-3.13-1.6 GM/177ML PO SOLN: 1.0000 | Freq: Once | ORAL | Status: DC

## 2014-09-13 NOTE — Assessment & Plan Note (Addendum)
AVERAGE RISK  TCS JUN 2016 AT 0830 PER PT REQUEST-SUPREP. DISCUSSED PROCEDURE, BENEFITS, & RISKS. DISCUSSED NEED FOR SCREENING FOR BARRETT'S ESOPHAGUS, BENEFITS OF HAVING EGD AND RISK OF NOT HAVING EGD INCLUDING ADVANCED ESOPHAGEAL CANCER, AND MANAGEMENT OF BARRETT'S ESOPHAGUS. PT DECLINES EGD AT THIS TIME. OPV PRN

## 2014-09-13 NOTE — Progress Notes (Signed)
   Subjective:    Patient ID: Edward Rhodes, male    DOB: 07/05/1955, 59 y.o.   MRN: [id number]  No primary care provider on file.  HPI BmS: regular. OCCASIONAL IBUPROFEN FOR BACK PAIN: 1-2X/MO.  PT DENIES FEVER, CHILLS, HEMATOCHEZIA, nausea, vomiting, melena, diarrhea, CHEST PAIN, SHORTNESS OF BREATH,  CHANGE IN BOWEL IN HABITS, constipation, abdominal pain, problems swallowing, problems with sedation, or heartburn or indigestion. NO ETOH. SMOKES THC: 1-2X/WEEK FOR ANXIETY.  Past Medical History  Diagnosis Date  . Cancer     prostate  . Cervical radiculopathy    Past Surgical History  Procedure Laterality Date  . Back surgery    . Wrist surgery    . Prostatectomy  2008  . Hydrocele excision    . Colonoscopy  Dec 2006    [place]: descending colon scattered diverticula, 11mm AVM in transverse colon, no polyps. Routine screening 2016.    Allergies  Allergen Reactions  . Aleve [Naproxen Sodium]     hives    No current outpatient prescriptions on file.   No current facility-administered medications for this visit.   Family History  Problem Relation Age of Onset  . Heart disease Father   . Dementia Father   . Prostate cancer Father   . Drug abuse Son   . Dementia Mother   . Breast cancer Sister   . Colon cancer Neg Hx   . Colon polyps Neg Hx     History   Social History  . Marital Status: Married    Spouse Name: N/A  . Number of Children: N/A  . Years of Education: N/A   Occupational History  . Not on file.   Social History Main Topics  . Smoking status: Never Smoker   . Smokeless tobacco: Not on file  . Alcohol Use: Yes     Comment: 2-3 glasses wine once per week, h/o heavy drinking 25 years ago  . Drug Use: Yes    Special: Marijuana     Comment: couple times a week   . Sexual Activity: Not on file   Other Topics Concern  . Not on file   Social History Narrative   Son passed away 08-16-2012 from narcotic addiction. Started when was bitten by snake  and required narcotic pain meds.     Review of Systems PER HPI OTHERWISE ALL SYSTEMS ARE NEGATIVE.    Objective:   Physical Exam  Constitutional: He is oriented to person, place, and time. He appears well-developed and well-nourished. No distress.  HENT:  Head: Normocephalic and atraumatic.  Mouth/Throat: Oropharynx is clear and moist. No oropharyngeal exudate.  Eyes: Pupils are equal, round, and reactive to light. No scleral icterus.  Neck: Normal range of motion. Neck supple.  Cardiovascular: Normal rate, regular rhythm and normal heart sounds.   Pulmonary/Chest: Effort normal and breath sounds normal. No respiratory distress.  Abdominal: Soft. Bowel sounds are normal. He exhibits no distension. There is no tenderness.  Musculoskeletal: He exhibits no edema.  Lymphadenopathy:    He has no cervical adenopathy.  Neurological: He is alert and oriented to person, place, and time.  NO FOCAL DEFICITS   Psychiatric:  SLIGHTLY ANXIOUS MOOD, NL AFFECT   Vitals reviewed.         Assessment & Plan:

## 2014-09-13 NOTE — Patient Instructions (Signed)
FOLLOW FULL LIQUID DIET ON JUN 23. SEE INFO BELOW.  TAKE BOWEL PREP JUN 23.  COLONOSCOPY ON JUN 24.  PLEASE LET ME KNOW IF YOU CHANGE YOUR MIND ABOUT BEING SCREENED FOR BARRETT'S ESOPHAGUS.   Full Liquid Diet A high-calorie, high-protein supplement should be used to meet your nutritional requirements when the full liquid diet is continued for more than 2 or 3 days. If this diet is to be used for an extended period of time (more than 7 days), a multivitamin should be considered.  Breads and Starches  Allowed: None are allowed except crackers WHOLE OR pureed (made into a thick, smooth soup) in soup.   Avoid: Any others.    Potatoes/Pasta/Rice  Allowed: ANY ITEM AS A SOUP OR SMALL PLATE OF MASHED POTATOES.       Vegetables  Allowed: Strained tomato or vegetable juice. Vegetables pureed in soup.   Avoid: Any others.    Fruit  Allowed: Any strained fruit juices and fruit drinks. Include 1 serving of citrus or vitamin C-enriched fruit juice daily.   Avoid: Any others.  Meat and Meat Substitutes  Allowed: Egg  Avoid: Any meat, fish, or fowl. All cheese.  Milk  Allowed: Milk beverages, including milk shakes and instant breakfast mixes. Smooth yogurt.   Avoid: Any others. Avoid dairy products if not tolerated.    Soups and Combination Foods  Allowed: Broth, strained cream soups. Strained, broth-based soups.   Avoid: Any others.    Desserts and Sweets  Allowed: flavored gelatin,plain ice cream, sherbet, smooth pudding, junket, fruit ices, frozen ice pops, pudding pops,, frozen fudge pops, chocolate syrup. Sugar, honey, jelly, syrup.   Avoid: Any others.  Fats and Oils  Allowed: Margarine, butter, cream, sour cream, oils.   Avoid: Any others.  Beverages  Allowed: All.   Avoid: None.  Condiments  Allowed: Iodized salt, pepper, spices, flavorings. Cocoa powder.   Avoid: Any others.    SAMPLE MEAL PLAN Breakfast   cup orange juice.   1 OR 2 EGGS    1 cup  milk.   1 cup beverage (coffee or tea).   Cream or sugar, if desired.    Midmorning Snack  2 SCRAMBLED OR HARD BOILED EGG   Lunch  1 cup cream soup.    cup fruit juice.   1 cup milk.    cup custard.   1 cup beverage (coffee or tea).   Cream or sugar, if desired.    Midafternoon Snack  1 cup milk shake.  Dinner  1 cup cream soup.    cup fruit juice.   1 cup milk.    cup pudding.   1 cup beverage (coffee or tea).   Cream or sugar, if desired.  Evening Snack  1 cup supplement.  To increase calories, add sugar, cream, butter, or margarine if possible. Nutritional supplements will also increase the total calories.  Marland Kitchen

## 2014-09-13 NOTE — Progress Notes (Signed)
REVIEWED-NO ADDITIONAL RECOMMENDATIONS. 

## 2014-09-27 NOTE — Progress Notes (Signed)
No pcp

## 2014-10-13 ENCOUNTER — Encounter (HOSPITAL_COMMUNITY): Payer: Self-pay | Admitting: *Deleted

## 2014-10-13 ENCOUNTER — Ambulatory Visit (HOSPITAL_COMMUNITY)
Admission: RE | Admit: 2014-10-13 | Discharge: 2014-10-13 | Disposition: A | Discharge status: Home / Self Care | Payer: Medicare Other | Source: Ambulatory Visit | Attending: Gastroenterology | Admitting: Gastroenterology

## 2014-10-13 ENCOUNTER — Encounter (HOSPITAL_COMMUNITY)
Admission: RE | Disposition: A | Discharge status: Home / Self Care | Payer: Self-pay | Source: Ambulatory Visit | Attending: Gastroenterology

## 2014-10-13 DIAGNOSIS — D123 Benign neoplasm of transverse colon: Secondary | ICD-10-CM | POA: Insufficient documentation

## 2014-10-13 DIAGNOSIS — Z1211 Encounter for screening for malignant neoplasm of colon: Secondary | ICD-10-CM | POA: Insufficient documentation

## 2014-10-13 DIAGNOSIS — K648 Other hemorrhoids: Secondary | ICD-10-CM | POA: Diagnosis not present

## 2014-10-13 DIAGNOSIS — K6389 Other specified diseases of intestine: Secondary | ICD-10-CM | POA: Diagnosis not present

## 2014-10-13 DIAGNOSIS — Z9079 Acquired absence of other genital organ(s): Secondary | ICD-10-CM | POA: Diagnosis not present

## 2014-10-13 DIAGNOSIS — M5412 Radiculopathy, cervical region: Secondary | ICD-10-CM | POA: Diagnosis not present

## 2014-10-13 DIAGNOSIS — M6289 Other specified disorders of muscle: Secondary | ICD-10-CM | POA: Insufficient documentation

## 2014-10-13 DIAGNOSIS — K644 Residual hemorrhoidal skin tags: Secondary | ICD-10-CM | POA: Diagnosis not present

## 2014-10-13 DIAGNOSIS — Z8546 Personal history of malignant neoplasm of prostate: Secondary | ICD-10-CM | POA: Diagnosis not present

## 2014-10-13 DIAGNOSIS — D122 Benign neoplasm of ascending colon: Secondary | ICD-10-CM | POA: Diagnosis not present

## 2014-10-13 DIAGNOSIS — K5730 Diverticulosis of large intestine without perforation or abscess without bleeding: Secondary | ICD-10-CM | POA: Insufficient documentation

## 2014-10-13 HISTORY — PX: COLONOSCOPY: SHX5424

## 2014-10-13 SURGERY — COLONOSCOPY
Anesthesia: Moderate Sedation

## 2014-10-13 MED ORDER — PROMETHAZINE HCL 25 MG/ML IJ SOLN
INTRAMUSCULAR | Status: AC
  Filled 2014-10-13: qty 1

## 2014-10-13 MED ORDER — MIDAZOLAM HCL 5 MG/5ML IJ SOLN
INTRAMUSCULAR | Status: DC | PRN
  Administered 2014-10-13 (×2): 2 mg via INTRAVENOUS
  Administered 2014-10-13: 1 mg via INTRAVENOUS

## 2014-10-13 MED ORDER — MEPERIDINE HCL 100 MG/ML IJ SOLN
INTRAMUSCULAR | Status: AC
  Filled 2014-10-13: qty 2

## 2014-10-13 MED ORDER — SODIUM CHLORIDE 0.9 % IV SOLN
INTRAVENOUS | Status: DC
  Administered 2014-10-13: 08:00:00 via INTRAVENOUS

## 2014-10-13 MED ORDER — MEPERIDINE HCL 100 MG/ML IJ SOLN
INTRAMUSCULAR | Status: DC | PRN
  Administered 2014-10-13: 50 mg

## 2014-10-13 MED ORDER — STERILE WATER FOR IRRIGATION IR SOLN
Status: DC | PRN
  Administered 2014-10-13: 09:00:00

## 2014-10-13 MED ORDER — MIDAZOLAM HCL 5 MG/5ML IJ SOLN
INTRAMUSCULAR | Status: AC
  Filled 2014-10-13: qty 10

## 2014-10-13 MED ORDER — SODIUM CHLORIDE 0.9 % IJ SOLN
INTRAMUSCULAR | Status: AC
  Filled 2014-10-13: qty 3

## 2014-10-13 MED ORDER — PROMETHAZINE HCL 25 MG/ML IJ SOLN
25.0000 mg | Freq: Once | INTRAMUSCULAR | Status: AC
  Administered 2014-10-13: 25 mg via INTRAVENOUS
  Filled 2014-10-13: qty 1

## 2014-10-13 NOTE — H&P (View-Only) (Signed)
   Subjective:    Patient ID: Edward Rhodes, male    DOB: May 08, 1955, 60 y.o.   MRN: [id number]  No primary care provider on file.  HPI BmS: regular. OCCASIONAL IBUPROFEN FOR BACK PAIN: 1-2X/MO.  PT DENIES FEVER, CHILLS, HEMATOCHEZIA, nausea, vomiting, melena, diarrhea, CHEST PAIN, SHORTNESS OF BREATH,  CHANGE IN BOWEL IN HABITS, constipation, abdominal pain, problems swallowing, problems with sedation, or heartburn or indigestion. NO ETOH. SMOKES THC: 1-2X/WEEK FOR ANXIETY.  Past Medical History  Diagnosis Date  . Cancer     prostate  . Cervical radiculopathy    Past Surgical History  Procedure Laterality Date  . Back surgery    . Wrist surgery    . Prostatectomy  2008  . Hydrocele excision    . Colonoscopy  Dec 2006    [place]: descending colon scattered diverticula, 36mm AVM in transverse colon, no polyps. Routine screening 2016.    Allergies  Allergen Reactions  . Aleve [Naproxen Sodium]     hives    No current outpatient prescriptions on file.   No current facility-administered medications for this visit.   Family History  Problem Relation Age of Onset  . Heart disease Father   . Dementia Father   . Prostate cancer Father   . Drug abuse Son   . Dementia Mother   . Breast cancer Sister   . Colon cancer Neg Hx   . Colon polyps Neg Hx     History   Social History  . Marital Status: Married    Spouse Name: N/A  . Number of Children: N/A  . Years of Education: N/A   Occupational History  . Not on file.   Social History Main Topics  . Smoking status: Never Smoker   . Smokeless tobacco: Not on file  . Alcohol Use: Yes     Comment: 2-3 glasses wine once per week, h/o heavy drinking 25 years ago  . Drug Use: Yes    Special: Marijuana     Comment: couple times a week   . Sexual Activity: Not on file   Other Topics Concern  . Not on file   Social History Narrative   Son passed away 2012-08-18 from narcotic addiction. Started when was bitten by snake  and required narcotic pain meds.     Review of Systems PER HPI OTHERWISE ALL SYSTEMS ARE NEGATIVE.    Objective:   Physical Exam  Constitutional: He is oriented to person, place, and time. He appears well-developed and well-nourished. No distress.  HENT:  Head: Normocephalic and atraumatic.  Mouth/Throat: Oropharynx is clear and moist. No oropharyngeal exudate.  Eyes: Pupils are equal, round, and reactive to light. No scleral icterus.  Neck: Normal range of motion. Neck supple.  Cardiovascular: Normal rate, regular rhythm and normal heart sounds.   Pulmonary/Chest: Effort normal and breath sounds normal. No respiratory distress.  Abdominal: Soft. Bowel sounds are normal. He exhibits no distension. There is no tenderness.  Musculoskeletal: He exhibits no edema.  Lymphadenopathy:    He has no cervical adenopathy.  Neurological: He is alert and oriented to person, place, and time.  NO FOCAL DEFICITS   Psychiatric:  SLIGHTLY ANXIOUS MOOD, NL AFFECT   Vitals reviewed.         Assessment & Plan:

## 2014-10-13 NOTE — Interval H&P Note (Signed)
History and Physical Interval Note:  10/13/2014 8:31 AM  Edward Rhodes  has presented today for surgery, with the diagnosis of screening colonoscopy  The various methods of treatment have been discussed with the patient and family. After consideration of risks, benefits and other options for treatment, the patient has consented to  Procedure(s) with comments: COLONOSCOPY (N/A) - 830 as a surgical intervention .  The patient's history has been reviewed, patient examined, no change in status, stable for surgery.  I have reviewed the patient's chart and labs.  Questions were answered to the patient's satisfaction.     [employer]

## 2014-10-13 NOTE — Discharge Instructions (Addendum)
You had 2 polyps removed. You have large external and internal hemorrhoids and diverticulosis IN YOUR LEFT COLON.   FOLLOW A HIGH FIBER DIET. AVOID ITEMS THAT CAUSE BLOATING. SEE INFO BELOW.  YOUR BIOPSY RESULTS WILL BE AVAILABLE IN MY CHART AFTER JUL 5 AND MY OFFICE WILL CONTACT YOU IN 10-14 DAYS WITH YOUR RESULTS.   Next colonoscopy in 5-10 years.   Colonoscopy Care After Read the instructions outlined below and refer to this sheet in the next week. These discharge instructions provide you with general information on caring for yourself after you leave the hospital. While your treatment has been planned according to the most current medical practices available, unavoidable complications occasionally occur. If you have any problems or questions after discharge, call DR. Gerilyn Stargell, [phone].  ACTIVITY  You may resume your regular activity, but move at a slower pace for the next 24 hours.   Take frequent rest periods for the next 24 hours.   Walking will help get rid of the air and reduce the bloated feeling in your belly (abdomen).   No driving for 24 hours (because of the medicine (anesthesia) used during the test).   You may shower.   Do not sign any important legal documents or operate any machinery for 24 hours (because of the anesthesia used during the test).    NUTRITION  Drink plenty of fluids.   You may resume your normal diet as instructed by your doctor.   Begin with a light meal and progress to your normal diet. Heavy or fried foods are harder to digest and may make you feel sick to your stomach (nauseated).   Avoid alcoholic beverages for 24 hours or as instructed.    MEDICATIONS  You may resume your normal medications.   WHAT YOU CAN EXPECT TODAY  Some feelings of bloating in the abdomen.   Passage of more gas than usual.   Spotting of blood in your stool or on the toilet paper  .  IF YOU HAD POLYPS REMOVED DURING THE COLONOSCOPY:  Eat a soft diet  IF YOU HAVE NAUSEA, BLOATING, ABDOMINAL PAIN, OR VOMITING.    FINDING OUT THE RESULTS OF YOUR TEST Not all test results are available during your visit. DR. Oneida Alar WILL CALL YOU WITHIN 14 DAYS OF YOUR PROCEDUE WITH YOUR RESULTS. Do not assume everything is normal if you have not heard from DR. Iyesha Such, CALL HER OFFICE AT [phone].  SEEK IMMEDIATE MEDICAL ATTENTION AND CALL THE OFFICE: [phone] IF:  You have more than a spotting of blood in your stool.   Your belly is swollen (abdominal distention).   You are nauseated or vomiting.   You have a temperature over 101F.   You have abdominal pain or discomfort that is severe or gets worse throughout the day.  Polyps, Colon  A polyp is extra tissue that grows inside your body. Colon polyps grow in the large intestine. The large intestine, also called the colon, is part of your digestive system. It is a long, hollow tube at the end of your digestive tract where your body makes and stores stool. Most polyps are not dangerous. They are benign. This means they are not cancerous. But over time, some types of polyps can turn into cancer. Polyps that are smaller than a pea are usually not harmful. But larger polyps could someday become or may already be cancerous. To be safe, doctors remove all polyps and test them.   PREVENTION There is not one sure way to  prevent polyps. You might be able to lower your risk of getting them if you:  Eat more fruits and vegetables and less fatty food.   Do not smoke.   Avoid alcohol.   Exercise every day.   Lose weight if you are overweight.   Eating more calcium and folate can also lower your risk of getting polyps. Some foods that are rich in calcium are milk, cheese, and broccoli. Some foods that are rich in folate are chickpeas, kidney beans, and spinach.   High-Fiber Diet A high-fiber diet changes your normal diet to include more whole grains, legumes, fruits, and vegetables. Changes in the diet  involve replacing refined carbohydrates with unrefined foods. The calorie level of the diet is essentially unchanged. The Dietary Reference Intake (recommended amount) for adult males is 38 grams per day. For adult females, it is 25 grams per day. Pregnant and lactating women should consume 28 grams of fiber per day. Fiber is the intact part of a plant that is not broken down during digestion. Functional fiber is fiber that has been isolated from the plant to provide a beneficial effect in the body. PURPOSE  Increase stool bulk.   Ease and regulate bowel movements.   Lower cholesterol.  REDUCE RISK OF COLON CANCER  INDICATIONS THAT YOU NEED MORE FIBER  Constipation and hemorrhoids.   Uncomplicated diverticulosis (intestine condition) and irritable bowel syndrome.   Weight management.   As a protective measure against hardening of the arteries (atherosclerosis), diabetes, and cancer.   GUIDELINES FOR INCREASING FIBER IN THE DIET  Start adding fiber to the diet slowly. A gradual increase of about 5 more grams (2 slices of whole-wheat bread, 2 servings of most fruits or vegetables, or 1 bowl of high-fiber cereal) per day is best. Too rapid an increase in fiber may result in constipation, flatulence, and bloating.   Drink enough water and fluids to keep your urine clear or pale yellow. Water, juice, or caffeine-free drinks are recommended. Not drinking enough fluid may cause constipation.   Eat a variety of high-fiber foods rather than one type of fiber.   Try to increase your intake of fiber through using high-fiber foods rather than fiber pills or supplements that contain small amounts of fiber.   The goal is to change the types of food eaten. Do not supplement your present diet with high-fiber foods, but replace foods in your present diet.  INCLUDE A VARIETY OF FIBER SOURCES  Replace refined and processed grains with whole grains, canned fruits with fresh fruits, and incorporate  other fiber sources. White rice, white breads, and most bakery goods contain little or no fiber.   Brown whole-grain rice, buckwheat oats, and many fruits and vegetables are all good sources of fiber. These include: broccoli, Brussels sprouts, cabbage, cauliflower, beets, sweet potatoes, white potatoes (skin on), carrots, tomatoes, eggplant, squash, berries, fresh fruits, and dried fruits.   Cereals appear to be the richest source of fiber. Cereal fiber is found in whole grains and bran. Bran is the fiber-rich outer coat of cereal grain, which is largely removed in refining. In whole-grain cereals, the bran remains. In breakfast cereals, the largest amount of fiber is found in those with "bran" in their names. The fiber content is sometimes indicated on the label.   You may need to include additional fruits and vegetables each day.   In baking, for 1 cup white flour, you may use the following substitutions:   1 cup whole-wheat flour minus 2  tablespoons.   1/2 cup white flour plus 1/2 cup whole-wheat flour.   Diverticulosis Diverticulosis is a common condition that develops when small pouches (diverticula) form in the wall of the colon. The risk of diverticulosis increases with age. It happens more often in people who eat a low-fiber diet. Most individuals with diverticulosis have no symptoms. Those individuals with symptoms usually experience belly (abdominal) pain, constipation, or loose stools (diarrhea).  HOME CARE INSTRUCTIONS  Increase the amount of fiber in your diet as directed by your caregiver or dietician. This may reduce symptoms of diverticulosis.   Drink at least 6 to 8 glasses of water each day to prevent constipation.   Try not to strain when you have a bowel movement.   Avoiding nuts and seeds to prevent complications is NOT NECESSARY.    FOODS HAVING HIGH FIBER CONTENT INCLUDE:  Fruits. Apple, peach, pear, tangerine, raisins, prunes.   Vegetables. Brussels sprouts,  asparagus, broccoli, cabbage, carrot, cauliflower, romaine lettuce, spinach, summer squash, tomato, winter squash, zucchini.   Starchy Vegetables. Baked beans, kidney beans, lima beans, split peas, lentils, potatoes (with skin).   Grains. Whole wheat bread, brown rice, bran flake cereal, plain oatmeal, white rice, shredded wheat, bran muffins.   SEEK IMMEDIATE MEDICAL CARE IF:  You develop increasing pain or severe bloating.   You have an oral temperature above 101F.   You develop vomiting or bowel movements that are bloody or black.   Hemorrhoids Hemorrhoids are dilated (enlarged) veins around the rectum. Sometimes clots will form in the veins. This makes them swollen and painful. These are called thrombosed hemorrhoids. Causes of hemorrhoids include:  Constipation.   Straining to have a bowel movement.   HEAVY LIFTING  HOME CARE INSTRUCTIONS  Eat a well balanced diet and drink 6 to 8 glasses of water every day to avoid constipation. You may also use a bulk laxative.   Avoid straining to have bowel movements.   Keep anal area dry and clean.   Do not use a donut shaped pillow or sit on the toilet for long periods. This increases blood pooling and pain.   Move your bowels when your body has the urge; this will require less straining and will decrease pain and pressure.

## 2014-10-13 NOTE — Op Note (Signed)
[facility] [address] [place], [address]   COLONOSCOPY PROCEDURE REPORT  PATIENT: Edward Rhodes, Edward Rhodes  MR#: [id number] BIRTHDATE: Nov 15, 1955 , 58  yrs. old GENDER: male ENDOSCOPIST: Danie Binder, MD REFERRED BY: PROCEDURE DATE:  11-01-2014 PROCEDURE:   Colonoscopy with cold biopsy polypectomy INDICATIONS:average risk patient for colon cancer. MEDICATIONS: Promethazine (Phenergan) 25 mg IV, Versed 5 mg IV, and Demerol 50 mg IV  DESCRIPTION OF PROCEDURE:    Physical exam was performed.  Informed consent was obtained from the patient after explaining the benefits, risks, and alternatives to procedure.  The patient was connected to monitor and placed in left lateral position. Continuous oxygen was provided by nasal cannula and IV medicine administered through an indwelling cannula.  After administration of sedation and rectal exam, the patients rectum was intubated and the EC-[id number] ([id number])  colonoscope was advanced under direct visualization to the cecum.  The scope was removed slowly by carefully examining the color, texture, anatomy, and integrity mucosa on the way out.  The patient was recovered in endoscopy and discharged home in satisfactory condition. Estimated blood loss is zero unless otherwise noted in this procedure report.       COLON FINDINGS: Two sessile polyps ranging from 2 to 80mm in size were found in the distal ascending colon(5MM) and distal transverse colon.  A polypectomy was performed with cold forceps.  , There was moderate diverticulosis noted in the sigmoid colon with associated muscular hypertrophy and tortuosity.  , Large external and internal hemorrhoids were found.  , and The colon was redundant.  Manual abdominal counter-pressure was used to reach the cecum.  PREP QUALITY: excellent.  CECAL W/D TIME: 14       minutes COMPLICATIONS:  IN SPITE OF SEDATION, PT MILDLY AGITATED WITH THE SCOPE LOOPING AND ABD PRESSURE BEING  APPLIED  ENDOSCOPIC IMPRESSION: 1.   Two COLON polyps REMOVED 2.   Moderate diverticulosis in the sigmoid colon 3.   Large external and internal hemorrhoids  RECOMMENDATIONS: AWAIT BIOPSY HIGH FIBER DIET NEXT TCS IN 5-10 YEARS WITH AN OVERTUBE   _______________________________ eSignedDanie Binder, MD 11/01/2014 9:32 AM    CPT CODES: ICD CODES:  The ICD and CPT codes recommended by this software are interpretations from the data that the clinical staff has captured with the software.  The verification of the translation of this report to the ICD and CPT codes and modifiers is the sole responsibility of the health care institution and practicing physician where this report was generated.  [employer] will not be held responsible for the validity of the ICD and CPT codes included on this report.  AMA assumes no liability for data contained or not contained herein. CPT is a Designer, television/film set of the [employer].

## 2014-10-17 ENCOUNTER — Encounter (HOSPITAL_COMMUNITY): Payer: Self-pay | Admitting: Gastroenterology

## 2014-10-17 DIAGNOSIS — Z8546 Personal history of malignant neoplasm of prostate: Secondary | ICD-10-CM | POA: Diagnosis not present

## 2014-10-17 DIAGNOSIS — E755 Other lipid storage disorders: Secondary | ICD-10-CM | POA: Diagnosis not present

## 2014-10-17 DIAGNOSIS — R197 Diarrhea, unspecified: Secondary | ICD-10-CM | POA: Diagnosis not present

## 2014-10-17 DIAGNOSIS — D075 Carcinoma in situ of prostate: Secondary | ICD-10-CM | POA: Diagnosis not present

## 2014-10-17 DIAGNOSIS — R030 Elevated blood-pressure reading, without diagnosis of hypertension: Secondary | ICD-10-CM | POA: Diagnosis not present

## 2014-10-17 DIAGNOSIS — R5383 Other fatigue: Secondary | ICD-10-CM | POA: Diagnosis not present

## 2014-10-24 DIAGNOSIS — N433 Hydrocele, unspecified: Secondary | ICD-10-CM | POA: Diagnosis not present

## 2014-10-24 DIAGNOSIS — E782 Mixed hyperlipidemia: Secondary | ICD-10-CM | POA: Diagnosis not present

## 2014-10-24 DIAGNOSIS — L723 Sebaceous cyst: Secondary | ICD-10-CM | POA: Diagnosis not present

## 2014-10-24 DIAGNOSIS — R7301 Impaired fasting glucose: Secondary | ICD-10-CM | POA: Diagnosis not present

## 2014-11-01 ENCOUNTER — Telehealth: Payer: Self-pay | Admitting: Gastroenterology

## 2014-11-01 NOTE — Telephone Encounter (Signed)
Pts wife is aware of results.

## 2014-11-01 NOTE — Telephone Encounter (Signed)
Reminder in epic °

## 2014-11-01 NOTE — Telephone Encounter (Signed)
Please call pt. HE had TWO simple adenomas removed.   FOLLOW A HIGH FIBER DIET.   TCS IN 5-10 YEARS.

## 2015-01-17 ENCOUNTER — Other Ambulatory Visit: Payer: Self-pay | Admitting: Urology

## 2015-01-17 ENCOUNTER — Ambulatory Visit (INDEPENDENT_AMBULATORY_CARE_PROVIDER_SITE_OTHER): Payer: Medicare Other | Admitting: Urology

## 2015-01-17 DIAGNOSIS — N4340 Spermatocele of epididymis, unspecified: Secondary | ICD-10-CM | POA: Diagnosis not present

## 2015-01-18 ENCOUNTER — Other Ambulatory Visit: Payer: Self-pay | Admitting: Urology

## 2015-01-18 DIAGNOSIS — N4340 Spermatocele of epididymis, unspecified: Secondary | ICD-10-CM

## 2015-01-19 ENCOUNTER — Ambulatory Visit (HOSPITAL_COMMUNITY)
Admission: RE | Admit: 2015-01-19 | Discharge: 2015-01-19 | Disposition: A | Discharge status: Home / Self Care | Payer: Medicare Other | Source: Ambulatory Visit | Attending: Urology | Admitting: Urology

## 2015-01-19 DIAGNOSIS — N503 Cyst of epididymis: Secondary | ICD-10-CM | POA: Diagnosis not present

## 2015-01-19 DIAGNOSIS — N4341 Spermatocele of epididymis, single: Secondary | ICD-10-CM | POA: Diagnosis present

## 2015-01-19 DIAGNOSIS — N4342 Spermatocele of epididymis, multiple: Secondary | ICD-10-CM | POA: Diagnosis not present

## 2015-01-19 DIAGNOSIS — N433 Hydrocele, unspecified: Secondary | ICD-10-CM | POA: Diagnosis not present

## 2015-01-19 DIAGNOSIS — N50812 Left testicular pain: Secondary | ICD-10-CM | POA: Diagnosis not present

## 2015-01-19 DIAGNOSIS — N4340 Spermatocele of epididymis, unspecified: Secondary | ICD-10-CM

## 2015-01-31 ENCOUNTER — Ambulatory Visit (INDEPENDENT_AMBULATORY_CARE_PROVIDER_SITE_OTHER): Payer: Medicare Other | Admitting: Urology

## 2015-01-31 DIAGNOSIS — N4340 Spermatocele of epididymis, unspecified: Secondary | ICD-10-CM

## 2015-01-31 DIAGNOSIS — N529 Male erectile dysfunction, unspecified: Secondary | ICD-10-CM | POA: Diagnosis not present

## 2015-02-13 ENCOUNTER — Emergency Department (HOSPITAL_COMMUNITY): Payer: Medicare Other

## 2015-02-13 ENCOUNTER — Emergency Department (HOSPITAL_COMMUNITY)
Admission: EM | Admit: 2015-02-13 | Discharge: 2015-02-13 | Disposition: A | Discharge status: Home / Self Care | Payer: Medicare Other | Attending: Emergency Medicine | Admitting: Emergency Medicine

## 2015-02-13 ENCOUNTER — Encounter (HOSPITAL_COMMUNITY): Payer: Self-pay | Admitting: Emergency Medicine

## 2015-02-13 DIAGNOSIS — Z23 Encounter for immunization: Secondary | ICD-10-CM | POA: Insufficient documentation

## 2015-02-13 DIAGNOSIS — S61244A Puncture wound with foreign body of right ring finger without damage to nail, initial encounter: Secondary | ICD-10-CM | POA: Diagnosis not present

## 2015-02-13 DIAGNOSIS — Z8739 Personal history of other diseases of the musculoskeletal system and connective tissue: Secondary | ICD-10-CM | POA: Insufficient documentation

## 2015-02-13 DIAGNOSIS — S60454A Superficial foreign body of right ring finger, initial encounter: Secondary | ICD-10-CM | POA: Diagnosis not present

## 2015-02-13 DIAGNOSIS — W268XXA Contact with other sharp object(s), not elsewhere classified, initial encounter: Secondary | ICD-10-CM | POA: Insufficient documentation

## 2015-02-13 DIAGNOSIS — Y9289 Other specified places as the place of occurrence of the external cause: Secondary | ICD-10-CM | POA: Diagnosis not present

## 2015-02-13 DIAGNOSIS — Y998 Other external cause status: Secondary | ICD-10-CM | POA: Insufficient documentation

## 2015-02-13 DIAGNOSIS — S6991XA Unspecified injury of right wrist, hand and finger(s), initial encounter: Secondary | ICD-10-CM | POA: Diagnosis present

## 2015-02-13 DIAGNOSIS — Y9301 Activity, walking, marching and hiking: Secondary | ICD-10-CM | POA: Insufficient documentation

## 2015-02-13 DIAGNOSIS — Z8546 Personal history of malignant neoplasm of prostate: Secondary | ICD-10-CM | POA: Insufficient documentation

## 2015-02-13 DIAGNOSIS — S60459A Superficial foreign body of unspecified finger, initial encounter: Secondary | ICD-10-CM

## 2015-02-13 MED ORDER — TETANUS-DIPHTH-ACELL PERTUSSIS 5-2.5-18.5 LF-MCG/0.5 IM SUSP
0.5000 mL | Freq: Once | INTRAMUSCULAR | Status: AC
  Administered 2015-02-13: 0.5 mL via INTRAMUSCULAR
  Filled 2015-02-13: qty 0.5

## 2015-02-13 MED ORDER — LIDOCAINE HCL (PF) 1 % IJ SOLN
5.0000 mL | Freq: Once | INTRAMUSCULAR | Status: AC
  Administered 2015-02-13: 5 mL via INTRADERMAL

## 2015-02-13 MED ORDER — LIDOCAINE HCL (PF) 1 % IJ SOLN
INTRAMUSCULAR | Status: AC
  Filled 2015-02-13: qty 5

## 2015-02-13 MED ORDER — BACITRACIN ZINC 500 UNIT/GM EX OINT
TOPICAL_OINTMENT | Freq: Once | CUTANEOUS | Status: AC
  Administered 2015-02-13: 1 via TOPICAL
  Filled 2015-02-13: qty 0.9

## 2015-02-13 NOTE — Discharge Instructions (Signed)
If your finger becomes hot, red, swollen or has pus or you have a fever, it might be infected and you should return here or see your doctor immediately. Otherwise, apply the topical antibiotic (bacitracin) 2 times per day for 3 days.

## 2015-02-13 NOTE — ED Notes (Signed)
Pt reports was sliding hand down the banister on wood stairs. Pt reports "splinter" to right ring finger. Pt reports tried to remove at home but reports "it broke it off". Wood splinter still noted to right ring finger.

## 2015-02-13 NOTE — ED Provider Notes (Signed)
CSN: [id number]     Arrival date & time 02/13/15  1316 History   First MD Initiated Contact with Patient 02/13/15 1407     Chief Complaint  Patient presents with  . Foreign Body     (Consider location/radiation/quality/duration/timing/severity/associated sxs/prior Treatment) HPI  59 year old male presents with a retained splinter in his right ring finger. Patient was sliding his hand on the banister of a wooden stair case when he was walking down. He felt a splinter puncture his finger. When he was trying to take it out part of it broke off and he can still feel splintered deep in his fingertip. No weakness or numbness. Last tetanus shot is unknown.  Past Medical History  Diagnosis Date  . Cancer [facility])     prostate  . Cervical radiculopathy    Past Surgical History  Procedure Laterality Date  . Back surgery    . Wrist surgery    . Prostatectomy  2008  . Hydrocele excision    . Colonoscopy  Dec 2006    [place]: descending colon scattered diverticula, 26mm AVM in transverse colon, no polyps. Routine screening 2016.   . Colonoscopy N/A 10/13/2014    Procedure: COLONOSCOPY;  Surgeon: Danie Binder, MD;  Location: [facility];  Service: Endoscopy;  Laterality: N/A;  35   Family History  Problem Relation Age of Onset  . Heart disease Father   . Dementia Father   . Prostate cancer Father   . Drug abuse Son   . Dementia Mother   . Breast cancer Sister   . Colon cancer Neg Hx   . Colon polyps Neg Hx    Social History  Substance Use Topics  . Smoking status: Never Smoker   . Smokeless tobacco: None  . Alcohol Use: Yes     Comment: 2-3 glasses wine once per week, h/o heavy drinking 25 years ago    Review of Systems  Constitutional: Negative for fever.  Musculoskeletal: Positive for arthralgias. Negative for joint swelling.  Skin: Positive for wound. Negative for color change.  Neurological: Negative for weakness and numbness.  All other systems reviewed and are  negative.     Allergies  Aleve  Home Medications   Prior to Admission medications   Medication Sig Start Date End Date Taking? Authorizing Provider  ibuprofen (ADVIL,MOTRIN) 200 MG tablet Take 400 mg by mouth every 8 (eight) hours as needed for mild pain.   Yes Historical Provider, MD   BP 133/90 mmHg  Pulse 62  Temp(Src) 98.2 F (36.8 C) (Oral)  Resp 14  Ht 5\' 11"  (1.803 m)  Wt 158 lb (71.668 kg)  BMI 22.05 kg/m2  SpO2 100% Physical Exam  Constitutional: He is oriented to person, place, and time. He appears well-developed and well-nourished.  HENT:  Head: Normocephalic and atraumatic.  Right Ear: External ear normal.  Left Ear: External ear normal.  Nose: Nose normal.  Eyes: Right eye exhibits no discharge. Left eye exhibits no discharge.  Cardiovascular: Normal rate and intact distal pulses.   Pulmonary/Chest: Effort normal.  Abdominal: He exhibits no distension.  Musculoskeletal: He exhibits no edema.       Right hand: He exhibits tenderness.       Hands: Neurological: He is alert and oriented to person, place, and time.  Skin: Skin is warm and dry. No erythema.  Nursing note and vitals reviewed.   ED Course  .Nerve Block Date/Time: 02/13/2015 2:27 PM Performed by: Sherwood Gambler Authorized by: Sherwood Gambler  Consent: Verbal consent obtained. Risks and benefits: risks, benefits and alternatives were discussed Consent given by: patient Indications: pain relief and wound distortion Body area: upper extremity Nerve: digital Laterality: right Patient sedated: no Preparation: Patient was prepped and draped in the usual sterile fashion. Patient position: sitting Needle gauge: 25 G Location technique: anatomical landmarks Local anesthetic: lidocaine 2% without epinephrine Outcome: pain improved Patient tolerance: Patient tolerated the procedure well with no immediate complications  .Foreign Body Removal Date/Time: 02/13/2015 2:54 PM Performed by: Sherwood Gambler Authorized by: Sherwood Gambler Consent: Verbal consent obtained. Risks and benefits: risks, benefits and alternatives were discussed Consent given by: patient Body area: skin General location: upper extremity Location details: right ring finger Anesthesia: digital block Patient sedated: no Patient restrained: no Patient cooperative: yes Localization method: visualized Removal mechanism: scalpel Dressing: dressing applied and antibiotic ointment Tendon involvement: none Depth: subcutaneous Complexity: simple 1 objects recovered. Objects recovered: wooden splinter Post-procedure assessment: foreign body removed Patient tolerance: Patient tolerated the procedure well with no immediate complications Comments: Scalpel used to make a linear incision over the FB. Splinter removed intact. No other foreign objects palpated or seen. Irrigated with saline after.   (including critical care time) Labs Review Labs Reviewed - No data to display  Imaging Review Dg Finger Ring Right  02/13/2015  CLINICAL DATA:  Splinter in right fourth finger. EXAM: RIGHT RING FINGER 2+V COMPARISON:  None. FINDINGS: There is no evidence of fracture or dislocation. There is no evidence of arthropathy. No radiopaque foreign body is noted. Probable benign bony protuberance arises laterally from distal phalanx. IMPRESSION: No radiopaque foreign body seen. No acute abnormality seen in the right fourth finger. Electronically Signed   By: Marijo Conception, M.D.   On: 02/13/2015 13:58   I have personally reviewed and evaluated these images and lab results as part of my medical decision-making.   EKG Interpretation None      MDM   Final diagnoses:  Foreign body of finger of right hand, initial encounter    After discussion of risks and benefits the splinter was removed as above. After good anesthesia from the digital block, Betadine was applied over the ring finger and then the incision as above with removal  of an intact splinter. Bacitracin applied over the wound and will be left open due to the foreign body was in it. Was updated on his tetanus. Discussed strict return precautions and wound care precautions.    Sherwood Gambler, MD 02/13/15 1536

## 2015-09-07 ENCOUNTER — Other Ambulatory Visit (HOSPITAL_COMMUNITY): Payer: Self-pay | Admitting: Adult Health Nurse Practitioner

## 2015-09-07 ENCOUNTER — Ambulatory Visit (HOSPITAL_COMMUNITY)
Admission: RE | Admit: 2015-09-07 | Discharge: 2015-09-07 | Disposition: A | Discharge status: Home / Self Care | Payer: Worker's Compensation | Source: Ambulatory Visit | Attending: Adult Health Nurse Practitioner | Admitting: Adult Health Nurse Practitioner

## 2015-09-07 DIAGNOSIS — M545 Low back pain, unspecified: Secondary | ICD-10-CM

## 2015-09-07 DIAGNOSIS — M419 Scoliosis, unspecified: Secondary | ICD-10-CM | POA: Insufficient documentation

## 2015-09-07 DIAGNOSIS — M47895 Other spondylosis, thoracolumbar region: Secondary | ICD-10-CM | POA: Insufficient documentation

## 2016-07-03 DIAGNOSIS — S30860A Insect bite (nonvenomous) of lower back and pelvis, initial encounter: Secondary | ICD-10-CM | POA: Diagnosis not present

## 2016-12-12 DIAGNOSIS — L570 Actinic keratosis: Secondary | ICD-10-CM | POA: Diagnosis not present

## 2017-05-29 DIAGNOSIS — R7301 Impaired fasting glucose: Secondary | ICD-10-CM | POA: Diagnosis not present

## 2017-05-29 DIAGNOSIS — E782 Mixed hyperlipidemia: Secondary | ICD-10-CM | POA: Diagnosis not present

## 2017-06-03 DIAGNOSIS — H04209 Unspecified epiphora, unspecified lacrimal gland: Secondary | ICD-10-CM | POA: Diagnosis not present

## 2017-06-03 DIAGNOSIS — Z23 Encounter for immunization: Secondary | ICD-10-CM | POA: Diagnosis not present

## 2017-06-03 DIAGNOSIS — E782 Mixed hyperlipidemia: Secondary | ICD-10-CM | POA: Diagnosis not present

## 2017-08-27 DIAGNOSIS — M2578 Osteophyte, vertebrae: Secondary | ICD-10-CM | POA: Diagnosis not present

## 2017-08-27 DIAGNOSIS — H8149 Vertigo of central origin, unspecified ear: Secondary | ICD-10-CM | POA: Diagnosis not present

## 2017-08-27 DIAGNOSIS — R1110 Vomiting, unspecified: Secondary | ICD-10-CM | POA: Diagnosis not present

## 2017-08-27 DIAGNOSIS — H04209 Unspecified epiphora, unspecified lacrimal gland: Secondary | ICD-10-CM | POA: Diagnosis not present

## 2017-08-28 ENCOUNTER — Other Ambulatory Visit (HOSPITAL_COMMUNITY): Payer: Self-pay | Admitting: Internal Medicine

## 2017-08-28 DIAGNOSIS — M779 Enthesopathy, unspecified: Secondary | ICD-10-CM

## 2017-08-28 DIAGNOSIS — M2578 Osteophyte, vertebrae: Secondary | ICD-10-CM

## 2017-09-02 ENCOUNTER — Ambulatory Visit (HOSPITAL_COMMUNITY)
Admission: RE | Admit: 2017-09-02 | Discharge: 2017-09-02 | Disposition: A | Discharge status: Home / Self Care | Payer: Medicare Other | Source: Ambulatory Visit | Attending: Internal Medicine | Admitting: Internal Medicine

## 2017-09-02 DIAGNOSIS — M2578 Osteophyte, vertebrae: Secondary | ICD-10-CM | POA: Diagnosis present

## 2017-09-02 DIAGNOSIS — M542 Cervicalgia: Secondary | ICD-10-CM | POA: Diagnosis not present

## 2017-09-02 DIAGNOSIS — M47812 Spondylosis without myelopathy or radiculopathy, cervical region: Secondary | ICD-10-CM | POA: Insufficient documentation

## 2017-09-02 DIAGNOSIS — M4802 Spinal stenosis, cervical region: Secondary | ICD-10-CM | POA: Insufficient documentation

## 2017-09-02 DIAGNOSIS — M779 Enthesopathy, unspecified: Secondary | ICD-10-CM

## 2017-09-03 ENCOUNTER — Ambulatory Visit (HOSPITAL_COMMUNITY): Payer: Medicare Other

## 2017-09-29 ENCOUNTER — Other Ambulatory Visit (HOSPITAL_COMMUNITY): Payer: Self-pay | Admitting: Neurosurgery

## 2017-09-29 DIAGNOSIS — M4312 Spondylolisthesis, cervical region: Secondary | ICD-10-CM | POA: Diagnosis not present

## 2017-09-29 DIAGNOSIS — R030 Elevated blood-pressure reading, without diagnosis of hypertension: Secondary | ICD-10-CM | POA: Diagnosis not present

## 2017-09-29 DIAGNOSIS — M542 Cervicalgia: Secondary | ICD-10-CM | POA: Diagnosis not present

## 2017-10-02 DIAGNOSIS — M546 Pain in thoracic spine: Secondary | ICD-10-CM | POA: Diagnosis not present

## 2017-10-02 DIAGNOSIS — M542 Cervicalgia: Secondary | ICD-10-CM | POA: Diagnosis not present

## 2017-10-02 DIAGNOSIS — R293 Abnormal posture: Secondary | ICD-10-CM | POA: Diagnosis not present

## 2017-10-02 DIAGNOSIS — M2560 Stiffness of unspecified joint, not elsewhere classified: Secondary | ICD-10-CM | POA: Diagnosis not present

## 2017-10-05 DIAGNOSIS — M546 Pain in thoracic spine: Secondary | ICD-10-CM | POA: Diagnosis not present

## 2017-10-05 DIAGNOSIS — M2560 Stiffness of unspecified joint, not elsewhere classified: Secondary | ICD-10-CM | POA: Diagnosis not present

## 2017-10-05 DIAGNOSIS — R293 Abnormal posture: Secondary | ICD-10-CM | POA: Diagnosis not present

## 2017-10-05 DIAGNOSIS — M542 Cervicalgia: Secondary | ICD-10-CM | POA: Diagnosis not present

## 2017-10-08 DIAGNOSIS — R3121 Asymptomatic microscopic hematuria: Secondary | ICD-10-CM | POA: Diagnosis not present

## 2017-10-09 ENCOUNTER — Ambulatory Visit (HOSPITAL_COMMUNITY)
Admission: RE | Admit: 2017-10-09 | Discharge: 2017-10-09 | Disposition: A | Discharge status: Home / Self Care | Payer: Medicare Other | Source: Ambulatory Visit | Attending: Neurosurgery | Admitting: Neurosurgery

## 2017-10-09 ENCOUNTER — Other Ambulatory Visit (HOSPITAL_COMMUNITY): Payer: Self-pay | Admitting: Neurosurgery

## 2017-10-09 DIAGNOSIS — M4312 Spondylolisthesis, cervical region: Secondary | ICD-10-CM | POA: Insufficient documentation

## 2017-10-09 DIAGNOSIS — Z6821 Body mass index (BMI) 21.0-21.9, adult: Secondary | ICD-10-CM | POA: Diagnosis not present

## 2017-10-09 DIAGNOSIS — M47812 Spondylosis without myelopathy or radiculopathy, cervical region: Secondary | ICD-10-CM | POA: Diagnosis not present

## 2017-10-12 DIAGNOSIS — M542 Cervicalgia: Secondary | ICD-10-CM | POA: Diagnosis not present

## 2017-10-12 DIAGNOSIS — M546 Pain in thoracic spine: Secondary | ICD-10-CM | POA: Diagnosis not present

## 2017-10-12 DIAGNOSIS — M2560 Stiffness of unspecified joint, not elsewhere classified: Secondary | ICD-10-CM | POA: Diagnosis not present

## 2017-10-12 DIAGNOSIS — R293 Abnormal posture: Secondary | ICD-10-CM | POA: Diagnosis not present

## 2017-10-26 DIAGNOSIS — R3129 Other microscopic hematuria: Secondary | ICD-10-CM | POA: Diagnosis not present

## 2017-10-26 DIAGNOSIS — R3121 Asymptomatic microscopic hematuria: Secondary | ICD-10-CM | POA: Diagnosis not present

## 2017-10-28 ENCOUNTER — Ambulatory Visit: Payer: Medicare Other | Admitting: Urology

## 2017-10-28 DIAGNOSIS — R311 Benign essential microscopic hematuria: Secondary | ICD-10-CM | POA: Diagnosis not present

## 2017-11-04 ENCOUNTER — Ambulatory Visit (HOSPITAL_COMMUNITY)
Admission: RE | Admit: 2017-11-04 | Discharge: 2017-11-04 | Disposition: A | Discharge status: Home / Self Care | Payer: Medicare Other | Source: Ambulatory Visit | Attending: Adult Health Nurse Practitioner | Admitting: Adult Health Nurse Practitioner

## 2017-11-04 ENCOUNTER — Other Ambulatory Visit (HOSPITAL_COMMUNITY): Payer: Self-pay | Admitting: Adult Health Nurse Practitioner

## 2017-11-04 DIAGNOSIS — R1110 Vomiting, unspecified: Secondary | ICD-10-CM | POA: Diagnosis not present

## 2017-11-04 DIAGNOSIS — R52 Pain, unspecified: Secondary | ICD-10-CM

## 2017-11-04 DIAGNOSIS — M5136 Other intervertebral disc degeneration, lumbar region: Secondary | ICD-10-CM | POA: Insufficient documentation

## 2017-11-04 DIAGNOSIS — M545 Low back pain: Secondary | ICD-10-CM | POA: Diagnosis not present

## 2017-11-04 DIAGNOSIS — M2578 Osteophyte, vertebrae: Secondary | ICD-10-CM | POA: Diagnosis not present

## 2017-11-04 DIAGNOSIS — F5101 Primary insomnia: Secondary | ICD-10-CM | POA: Diagnosis not present

## 2017-11-04 DIAGNOSIS — H8149 Vertigo of central origin, unspecified ear: Secondary | ICD-10-CM | POA: Diagnosis not present

## 2017-11-04 DIAGNOSIS — S30860A Insect bite (nonvenomous) of lower back and pelvis, initial encounter: Secondary | ICD-10-CM | POA: Diagnosis not present

## 2017-11-04 DIAGNOSIS — Z6821 Body mass index (BMI) 21.0-21.9, adult: Secondary | ICD-10-CM | POA: Diagnosis not present

## 2017-11-04 DIAGNOSIS — I700 Atherosclerosis of aorta: Secondary | ICD-10-CM | POA: Diagnosis not present

## 2017-11-24 DIAGNOSIS — H6090 Unspecified otitis externa, unspecified ear: Secondary | ICD-10-CM | POA: Diagnosis not present

## 2017-11-24 DIAGNOSIS — Z6821 Body mass index (BMI) 21.0-21.9, adult: Secondary | ICD-10-CM | POA: Diagnosis not present

## 2017-11-26 DIAGNOSIS — E875 Hyperkalemia: Secondary | ICD-10-CM | POA: Diagnosis not present

## 2017-11-26 DIAGNOSIS — E782 Mixed hyperlipidemia: Secondary | ICD-10-CM | POA: Diagnosis not present

## 2017-11-26 DIAGNOSIS — R7301 Impaired fasting glucose: Secondary | ICD-10-CM | POA: Diagnosis not present

## 2017-11-30 DIAGNOSIS — E875 Hyperkalemia: Secondary | ICD-10-CM | POA: Diagnosis not present

## 2017-11-30 DIAGNOSIS — E782 Mixed hyperlipidemia: Secondary | ICD-10-CM | POA: Diagnosis not present

## 2017-11-30 DIAGNOSIS — M19049 Primary osteoarthritis, unspecified hand: Secondary | ICD-10-CM | POA: Diagnosis not present

## 2017-11-30 DIAGNOSIS — R7301 Impaired fasting glucose: Secondary | ICD-10-CM | POA: Diagnosis not present

## 2017-12-16 ENCOUNTER — Ambulatory Visit: Payer: Medicare Other | Admitting: Urology

## 2017-12-16 DIAGNOSIS — R102 Pelvic and perineal pain: Secondary | ICD-10-CM

## 2017-12-16 DIAGNOSIS — R311 Benign essential microscopic hematuria: Secondary | ICD-10-CM | POA: Diagnosis not present

## 2017-12-28 ENCOUNTER — Encounter (HOSPITAL_COMMUNITY): Payer: Self-pay | Admitting: Emergency Medicine

## 2017-12-28 ENCOUNTER — Other Ambulatory Visit: Payer: Self-pay

## 2017-12-28 ENCOUNTER — Emergency Department (HOSPITAL_COMMUNITY)
Admission: EM | Admit: 2017-12-28 | Discharge: 2017-12-28 | Disposition: A | Discharge status: Home / Self Care | Payer: Medicare Other | Attending: Emergency Medicine | Admitting: Emergency Medicine

## 2017-12-28 ENCOUNTER — Emergency Department (HOSPITAL_COMMUNITY): Payer: Medicare Other

## 2017-12-28 DIAGNOSIS — H6501 Acute serous otitis media, right ear: Secondary | ICD-10-CM | POA: Insufficient documentation

## 2017-12-28 DIAGNOSIS — R42 Dizziness and giddiness: Secondary | ICD-10-CM | POA: Diagnosis not present

## 2017-12-28 DIAGNOSIS — Z8546 Personal history of malignant neoplasm of prostate: Secondary | ICD-10-CM | POA: Diagnosis not present

## 2017-12-28 DIAGNOSIS — Z79899 Other long term (current) drug therapy: Secondary | ICD-10-CM | POA: Insufficient documentation

## 2017-12-28 DIAGNOSIS — R51 Headache: Secondary | ICD-10-CM | POA: Diagnosis present

## 2017-12-28 DIAGNOSIS — R1110 Vomiting, unspecified: Secondary | ICD-10-CM | POA: Diagnosis not present

## 2017-12-28 LAB — CBC WITH DIFFERENTIAL/PLATELET
Basophils Absolute: 0 10*3/uL (ref 0.0–0.1)
Basophils Relative: 0 %
Eosinophils Absolute: 0 10*3/uL (ref 0.0–0.7)
Eosinophils Relative: 0 %
HEMATOCRIT: 47.4 % (ref 39.0–52.0)
HEMOGLOBIN: 17.3 g/dL — AB (ref 13.0–17.0)
LYMPHS ABS: 1.2 10*3/uL (ref 0.7–4.0)
Lymphocytes Relative: 13 %
MCH: 33.3 pg (ref 26.0–34.0)
MCHC: 36.5 g/dL — AB (ref 30.0–36.0)
MCV: 91.3 fL (ref 78.0–100.0)
MONO ABS: 0.8 10*3/uL (ref 0.1–1.0)
MONOS PCT: 8 %
NEUTROS ABS: 7.4 10*3/uL (ref 1.7–7.7)
NEUTROS PCT: 79 %
Platelets: 210 10*3/uL (ref 150–400)
RBC: 5.19 MIL/uL (ref 4.22–5.81)
RDW: 12.4 % (ref 11.5–15.5)
WBC: 9.4 10*3/uL (ref 4.0–10.5)

## 2017-12-28 LAB — COMPREHENSIVE METABOLIC PANEL
ALK PHOS: 59 U/L (ref 38–126)
ALT: 16 U/L (ref 0–44)
ANION GAP: 14 (ref 5–15)
AST: 18 U/L (ref 15–41)
Albumin: 5 g/dL (ref 3.5–5.0)
BILIRUBIN TOTAL: 1.1 mg/dL (ref 0.3–1.2)
BUN: 20 mg/dL (ref 8–23)
CALCIUM: 9.8 mg/dL (ref 8.9–10.3)
CO2: 22 mmol/L (ref 22–32)
Chloride: 98 mmol/L (ref 98–111)
Creatinine, Ser: 1.01 mg/dL (ref 0.61–1.24)
GLUCOSE: 133 mg/dL — AB (ref 70–99)
Potassium: 4.3 mmol/L (ref 3.5–5.1)
Sodium: 134 mmol/L — ABNORMAL LOW (ref 135–145)
Total Protein: 8.2 g/dL — ABNORMAL HIGH (ref 6.5–8.1)

## 2017-12-28 LAB — LACTIC ACID, PLASMA: Lactic Acid, Venous: 1.5 mmol/L (ref 0.5–1.9)

## 2017-12-28 MED ORDER — SODIUM CHLORIDE 0.9 % IV BOLUS
1000.0000 mL | Freq: Once | INTRAVENOUS | Status: AC
Start: 2017-12-28 — End: 2017-12-28
  Administered 2017-12-28: 1000 mL via INTRAVENOUS

## 2017-12-28 MED ORDER — LEVOFLOXACIN 500 MG PO TABS: 500.0000 mg | ORAL_TABLET | Freq: Every day | ORAL | 0 refills | Status: DC

## 2017-12-28 NOTE — ED Triage Notes (Signed)
Pt c/o of emesis since Thursday. Denies diarrhea

## 2017-12-28 NOTE — ED Provider Notes (Signed)
[facility] EMERGENCY DEPARTMENT Provider Note   CSN: [id number] Arrival date & time: 12/28/17  6222     History   Chief Complaint Chief Complaint  Patient presents with  . Emesis    HPI Edward Rhodes is a 62 y.o. male.  Patient states that he has had an earache headache fever chills nausea for weeks.  He was treated with amoxicillin and has been referred to ENT.  He has an appointment Friday with ENT doctor.  The history is provided by the patient. No language interpreter was used.  Illness  This is a recurrent problem. The current episode started more than 1 week ago. The problem occurs constantly. The problem has not changed since onset.Pertinent negatives include no chest pain, no abdominal pain and no headaches. Nothing aggravates the symptoms. Nothing relieves the symptoms. Treatments tried: Amoxicillin. The treatment provided no relief.    Past Medical History:  Diagnosis Date  . Cancer [facility])    prostate  . Cervical radiculopathy     Patient Active Problem List   Diagnosis Date Noted  . Encounter for screening colonoscopy 08/17/2013  . 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 Abdominal aortic ectasia ([place]) 06/06/2013  . Marijuana smoker 05/06/2013  . Osteoarthritis 05/06/2013  . Alcohol consumption of one to four drinks per day 05/06/2013  . Decreased hearing 05/06/2013  . Nausea alone 05/06/2013  . Loss of weight 05/06/2013  . Cervical radiculopathy   . Cancer [facility])     Past Surgical History:  Procedure Laterality Date  . BACK SURGERY    . COLONOSCOPY  Dec 2006   [place]: descending colon scattered diverticula, 49mm AVM in transverse colon, no polyps. Routine screening 2016.   Marland Kitchen COLONOSCOPY N/A 10/13/2014   Procedure: COLONOSCOPY;  Surgeon: Danie Binder, MD;  Location: [facility] ENDO SUITE;  Service: Endoscopy;  Laterality: N/A;  830  . HYDROCELE EXCISION    . PROSTATECTOMY  2008  . WRIST SURGERY          Home Medications    Prior to Admission  medications   Medication Sig Start Date End Date Taking? Authorizing Provider  ibuprofen (ADVIL,MOTRIN) 200 MG tablet Take 400 mg by mouth every 8 (eight) hours as needed for mild pain.    [provider]  levofloxacin (LEVAQUIN) 500 MG tablet Take 1 tablet (500 mg total) by mouth daily. 12/28/17   Milton Ferguson, MD    Family History Family History  Problem Relation Age of Onset  . Heart disease Father   . Dementia Father   . Prostate cancer Father   . Drug abuse Son   . Dementia Mother   . Breast cancer Sister   . Colon cancer Neg Hx   . Colon polyps Neg Hx     Social History Social History   Tobacco Use  . Smoking status: Never Smoker  . Smokeless tobacco: Never Used  Substance Use Topics  . Alcohol use: Yes    Comment: 2-3 glasses wine once per week, h/o heavy drinking 25 years ago  . Drug use: Yes    Types: Marijuana    Comment: couple times a week      Allergies   Aleve [naproxen sodium]   Review of Systems Review of Systems  Constitutional: Negative for appetite change and fatigue.  HENT: Negative for congestion, ear discharge and sinus pressure.   Eyes: Negative for discharge.  Respiratory: Negative for cough.   Cardiovascular: Negative for chest pain.  Gastrointestinal: Positive for vomiting.  Negative for abdominal pain and diarrhea.  Genitourinary: Negative for frequency and hematuria.  Musculoskeletal: Negative for back pain.  Skin: Negative for rash.  Neurological: Negative for seizures and headaches.  Psychiatric/Behavioral: Negative for hallucinations.     Physical Exam Updated Vital Signs BP (!) 147/95   Pulse (!) 57   Temp 98 F (36.7 C) (Oral)   Resp 15   Ht 5\' 10"  (1.778 m)   Wt 70.3 kg   SpO2 100%   BMI 22.24 kg/m   Physical Exam  Constitutional: He is oriented to person, place, and time. He appears well-developed.  HENT:  Head: Normocephalic.  Right TM inflamed  Eyes: Conjunctivae and EOM are normal. No scleral  icterus.  Neck: Neck supple. No thyromegaly present.  Cardiovascular: Normal rate and regular rhythm. Exam reveals no gallop and no friction rub.  No murmur heard. Pulmonary/Chest: No stridor. He has no wheezes. He has no rales. He exhibits no tenderness.  Abdominal: He exhibits no distension. There is no tenderness. There is no rebound.  Musculoskeletal: Normal range of motion. He exhibits no edema.  Lymphadenopathy:    He has no cervical adenopathy.  Neurological: He is oriented to person, place, and time. He exhibits normal muscle tone. Coordination normal.  Skin: No rash noted. No erythema.  Psychiatric: He has a normal mood and affect. His behavior is normal.     ED Treatments / Results  Labs (all labs ordered are listed, but only abnormal results are displayed) Labs Reviewed  CBC WITH DIFFERENTIAL/PLATELET - Abnormal; Notable for the following components:      Result Value   Hemoglobin 17.3 (*)    MCHC 36.5 (*)    All other components within normal limits  COMPREHENSIVE METABOLIC PANEL - Abnormal; Notable for the following components:   Sodium 134 (*)    Glucose, Bld 133 (*)    Total Protein 8.2 (*)    All other components within normal limits  LACTIC ACID, PLASMA    EKG None  Radiology Ct Head Wo Contrast  Result Date: 12/28/2017 CLINICAL DATA:  62 year old male with dizziness and vomiting since Thursday 12/24/2017. No known injury. Prostate cancer post prostatectomy. EXAM: CT HEAD WITHOUT CONTRAST TECHNIQUE: Contiguous axial images were obtained from the base of the skull through the vertex without intravenous contrast. COMPARISON:  None. FINDINGS: Brain: No intracranial hemorrhage. Streak artifact through posterior fossa slightly limits evaluation. No CT evidence of large acute infarct. Cerebellar tonsils minimally low lying within the range of normal limits. No hydrocephalus. No intracranial mass lesion noted on this unenhanced exam. Vascular: No hyperdense vessel.  Minimal atherosclerotic changes left carotid artery cavernous segment. Skull: No destructive or sclerotic lesion. Sinuses/Orbits: No acute orbital abnormality. Visualized paranasal sinuses, mastoid air cells and middle ear cavities are clear. Other: Negative IMPRESSION: No acute intracranial abnormality noted.  Please see above. Electronically Signed   By: Genia Del M.D.   On: 12/28/2017 09:19    Procedures Procedures (including critical care time)  Medications Ordered in ED Medications  sodium chloride 0.9 % bolus 1,000 mL (1,000 mLs Intravenous New Bag/Given 12/28/17 0817)     Initial Impression / Assessment and Plan / ED Course  I have reviewed the triage vital signs and the nursing notes.  Pertinent labs & imaging results that were available during my care of the patient were reviewed by me and considered in my medical decision making (see chart for details). Labs and x-rays unremarkable    Suspect persistent otitis.  He  was treated initially with amoxicillin.  We will place him on Levaquin and have him follow-up with ENT Friday as planned  Final Clinical Impressions(s) / ED Diagnoses   Final diagnoses:  Right acute serous otitis media, recurrence not specified    ED Discharge Orders         Ordered    levofloxacin (LEVAQUIN) 500 MG tablet  Daily     12/28/17 1018           Milton Ferguson, MD 12/28/17 1023

## 2017-12-28 NOTE — Discharge Instructions (Addendum)
Drink plenty of fluids and follow-up with the ear doctor as planned

## 2017-12-31 ENCOUNTER — Other Ambulatory Visit (INDEPENDENT_AMBULATORY_CARE_PROVIDER_SITE_OTHER): Payer: Self-pay | Admitting: Otolaryngology

## 2017-12-31 ENCOUNTER — Ambulatory Visit (INDEPENDENT_AMBULATORY_CARE_PROVIDER_SITE_OTHER): Payer: Medicare Other | Admitting: Otolaryngology

## 2017-12-31 DIAGNOSIS — IMO0001 Reserved for inherently not codable concepts without codable children: Secondary | ICD-10-CM

## 2017-12-31 DIAGNOSIS — R42 Dizziness and giddiness: Secondary | ICD-10-CM

## 2017-12-31 DIAGNOSIS — H9042 Sensorineural hearing loss, unilateral, left ear, with unrestricted hearing on the contralateral side: Secondary | ICD-10-CM

## 2017-12-31 DIAGNOSIS — H903 Sensorineural hearing loss, bilateral: Secondary | ICD-10-CM

## 2017-12-31 DIAGNOSIS — H8101 Meniere's disease, right ear: Secondary | ICD-10-CM | POA: Diagnosis not present

## 2018-01-06 ENCOUNTER — Ambulatory Visit (HOSPITAL_COMMUNITY)
Admission: RE | Admit: 2018-01-06 | Discharge: 2018-01-06 | Disposition: A | Discharge status: Home / Self Care | Payer: Medicare Other | Source: Ambulatory Visit | Attending: Otolaryngology | Admitting: Otolaryngology

## 2018-01-06 DIAGNOSIS — H6501 Acute serous otitis media, right ear: Secondary | ICD-10-CM | POA: Diagnosis not present

## 2018-01-06 DIAGNOSIS — H9042 Sensorineural hearing loss, unilateral, left ear, with unrestricted hearing on the contralateral side: Secondary | ICD-10-CM

## 2018-01-06 DIAGNOSIS — IMO0001 Reserved for inherently not codable concepts without codable children: Secondary | ICD-10-CM

## 2018-01-06 MED ORDER — GADOBUTROL 1 MMOL/ML IV SOLN
7.0000 mL | Freq: Once | INTRAVENOUS | Status: AC | PRN
  Administered 2018-01-06: 7 mL via INTRAVENOUS

## 2018-02-11 ENCOUNTER — Ambulatory Visit (INDEPENDENT_AMBULATORY_CARE_PROVIDER_SITE_OTHER): Payer: Medicare Other | Admitting: Otolaryngology

## 2018-02-11 DIAGNOSIS — H903 Sensorineural hearing loss, bilateral: Secondary | ICD-10-CM

## 2018-02-11 DIAGNOSIS — H8101 Meniere's disease, right ear: Secondary | ICD-10-CM | POA: Diagnosis not present

## 2018-03-17 DIAGNOSIS — J060 Acute laryngopharyngitis: Secondary | ICD-10-CM | POA: Diagnosis not present

## 2018-03-17 DIAGNOSIS — H9209 Otalgia, unspecified ear: Secondary | ICD-10-CM | POA: Diagnosis not present

## 2018-03-17 DIAGNOSIS — R197 Diarrhea, unspecified: Secondary | ICD-10-CM | POA: Diagnosis not present

## 2018-08-17 DIAGNOSIS — Z0000 Encounter for general adult medical examination without abnormal findings: Secondary | ICD-10-CM | POA: Diagnosis not present

## 2018-09-20 DIAGNOSIS — M19019 Primary osteoarthritis, unspecified shoulder: Secondary | ICD-10-CM | POA: Diagnosis not present

## 2018-09-20 DIAGNOSIS — R7301 Impaired fasting glucose: Secondary | ICD-10-CM | POA: Diagnosis not present

## 2018-09-20 DIAGNOSIS — E782 Mixed hyperlipidemia: Secondary | ICD-10-CM | POA: Diagnosis not present

## 2018-09-20 DIAGNOSIS — E875 Hyperkalemia: Secondary | ICD-10-CM | POA: Diagnosis not present

## 2018-10-14 DIAGNOSIS — R7301 Impaired fasting glucose: Secondary | ICD-10-CM | POA: Diagnosis not present

## 2018-10-14 DIAGNOSIS — M19019 Primary osteoarthritis, unspecified shoulder: Secondary | ICD-10-CM | POA: Diagnosis not present

## 2018-10-14 DIAGNOSIS — E875 Hyperkalemia: Secondary | ICD-10-CM | POA: Diagnosis not present

## 2018-10-14 DIAGNOSIS — E782 Mixed hyperlipidemia: Secondary | ICD-10-CM | POA: Diagnosis not present

## 2018-10-26 ENCOUNTER — Other Ambulatory Visit (HOSPITAL_COMMUNITY): Payer: Self-pay | Admitting: Urology

## 2018-10-26 ENCOUNTER — Other Ambulatory Visit: Payer: Self-pay | Admitting: Urology

## 2018-10-26 DIAGNOSIS — R311 Benign essential microscopic hematuria: Secondary | ICD-10-CM

## 2018-11-15 DIAGNOSIS — R7301 Impaired fasting glucose: Secondary | ICD-10-CM | POA: Diagnosis not present

## 2018-11-15 DIAGNOSIS — E875 Hyperkalemia: Secondary | ICD-10-CM | POA: Diagnosis not present

## 2018-11-15 DIAGNOSIS — E782 Mixed hyperlipidemia: Secondary | ICD-10-CM | POA: Diagnosis not present

## 2018-11-15 DIAGNOSIS — M19019 Primary osteoarthritis, unspecified shoulder: Secondary | ICD-10-CM | POA: Diagnosis not present

## 2018-12-16 DIAGNOSIS — E782 Mixed hyperlipidemia: Secondary | ICD-10-CM | POA: Diagnosis not present

## 2018-12-16 DIAGNOSIS — R7301 Impaired fasting glucose: Secondary | ICD-10-CM | POA: Diagnosis not present

## 2018-12-16 DIAGNOSIS — E875 Hyperkalemia: Secondary | ICD-10-CM | POA: Diagnosis not present

## 2018-12-16 DIAGNOSIS — M19019 Primary osteoarthritis, unspecified shoulder: Secondary | ICD-10-CM | POA: Diagnosis not present

## 2019-03-01 DIAGNOSIS — E782 Mixed hyperlipidemia: Secondary | ICD-10-CM | POA: Diagnosis not present

## 2019-03-01 DIAGNOSIS — M19019 Primary osteoarthritis, unspecified shoulder: Secondary | ICD-10-CM | POA: Diagnosis not present

## 2019-03-01 DIAGNOSIS — E875 Hyperkalemia: Secondary | ICD-10-CM | POA: Diagnosis not present

## 2019-03-01 DIAGNOSIS — R7301 Impaired fasting glucose: Secondary | ICD-10-CM | POA: Diagnosis not present

## 2019-03-16 DIAGNOSIS — E7849 Other hyperlipidemia: Secondary | ICD-10-CM | POA: Diagnosis not present

## 2019-03-16 DIAGNOSIS — M19019 Primary osteoarthritis, unspecified shoulder: Secondary | ICD-10-CM | POA: Diagnosis not present

## 2019-03-16 DIAGNOSIS — E782 Mixed hyperlipidemia: Secondary | ICD-10-CM | POA: Diagnosis not present

## 2019-04-19 DIAGNOSIS — R7301 Impaired fasting glucose: Secondary | ICD-10-CM | POA: Diagnosis not present

## 2019-04-19 DIAGNOSIS — M19019 Primary osteoarthritis, unspecified shoulder: Secondary | ICD-10-CM | POA: Diagnosis not present

## 2019-04-19 DIAGNOSIS — E782 Mixed hyperlipidemia: Secondary | ICD-10-CM | POA: Diagnosis not present

## 2019-06-30 ENCOUNTER — Telehealth: Payer: Self-pay | Admitting: Gastroenterology

## 2019-06-30 NOTE — Telephone Encounter (Signed)
Pt will need ov- hx of etoh and substance abuse. Pt also mildly agitated with last tcs despite anesthesia.

## 2019-06-30 NOTE — Telephone Encounter (Signed)
Pt is on the recall for a 5-10 yr colonoscopy with SF for July. Please advise if he needs a triage nurse visit or if he needs to come into the office to get scheduled.

## 2019-07-01 ENCOUNTER — Encounter: Payer: Self-pay | Admitting: Gastroenterology

## 2019-07-01 NOTE — Telephone Encounter (Signed)
Mailed appt letter and scheduled him the same day as his wife's appointment

## 2019-08-10 ENCOUNTER — Ambulatory Visit: Payer: Medicare Other | Admitting: Gastroenterology

## 2019-08-25 DIAGNOSIS — X32XXXA Exposure to sunlight, initial encounter: Secondary | ICD-10-CM | POA: Diagnosis not present

## 2019-08-25 DIAGNOSIS — L708 Other acne: Secondary | ICD-10-CM | POA: Diagnosis not present

## 2019-08-25 DIAGNOSIS — L821 Other seborrheic keratosis: Secondary | ICD-10-CM | POA: Diagnosis not present

## 2019-08-25 DIAGNOSIS — L570 Actinic keratosis: Secondary | ICD-10-CM | POA: Diagnosis not present

## 2019-09-05 DIAGNOSIS — E7849 Other hyperlipidemia: Secondary | ICD-10-CM | POA: Diagnosis not present

## 2019-09-05 DIAGNOSIS — F331 Major depressive disorder, recurrent, moderate: Secondary | ICD-10-CM | POA: Diagnosis not present

## 2019-09-05 DIAGNOSIS — R7301 Impaired fasting glucose: Secondary | ICD-10-CM | POA: Diagnosis not present

## 2019-09-05 DIAGNOSIS — E875 Hyperkalemia: Secondary | ICD-10-CM | POA: Diagnosis not present

## 2019-09-05 DIAGNOSIS — M19019 Primary osteoarthritis, unspecified shoulder: Secondary | ICD-10-CM | POA: Diagnosis not present

## 2019-09-05 DIAGNOSIS — E782 Mixed hyperlipidemia: Secondary | ICD-10-CM | POA: Diagnosis not present

## 2019-09-06 ENCOUNTER — Encounter: Payer: Self-pay | Admitting: Gastroenterology

## 2019-09-06 DIAGNOSIS — R221 Localized swelling, mass and lump, neck: Secondary | ICD-10-CM | POA: Diagnosis not present

## 2019-09-07 ENCOUNTER — Other Ambulatory Visit: Payer: Self-pay | Admitting: Otolaryngology

## 2019-09-13 DIAGNOSIS — R7301 Impaired fasting glucose: Secondary | ICD-10-CM | POA: Diagnosis not present

## 2019-09-13 DIAGNOSIS — F331 Major depressive disorder, recurrent, moderate: Secondary | ICD-10-CM | POA: Diagnosis not present

## 2019-09-13 DIAGNOSIS — E782 Mixed hyperlipidemia: Secondary | ICD-10-CM | POA: Diagnosis not present

## 2019-09-13 DIAGNOSIS — E875 Hyperkalemia: Secondary | ICD-10-CM | POA: Diagnosis not present

## 2019-09-13 DIAGNOSIS — E7849 Other hyperlipidemia: Secondary | ICD-10-CM | POA: Diagnosis not present

## 2019-09-13 DIAGNOSIS — M19019 Primary osteoarthritis, unspecified shoulder: Secondary | ICD-10-CM | POA: Diagnosis not present

## 2019-09-16 ENCOUNTER — Other Ambulatory Visit: Payer: Self-pay

## 2019-09-16 ENCOUNTER — Encounter (HOSPITAL_BASED_OUTPATIENT_CLINIC_OR_DEPARTMENT_OTHER): Payer: Self-pay | Admitting: Otolaryngology

## 2019-09-23 ENCOUNTER — Other Ambulatory Visit (HOSPITAL_COMMUNITY)
Admission: RE | Admit: 2019-09-23 | Discharge: 2019-09-23 | Disposition: A | Discharge status: Home / Self Care | Payer: Medicare HMO | Source: Ambulatory Visit | Attending: Otolaryngology | Admitting: Otolaryngology

## 2019-09-23 DIAGNOSIS — Z01812 Encounter for preprocedural laboratory examination: Secondary | ICD-10-CM | POA: Insufficient documentation

## 2019-09-23 DIAGNOSIS — Z20822 Contact with and (suspected) exposure to covid-19: Secondary | ICD-10-CM | POA: Insufficient documentation

## 2019-09-23 LAB — SARS CORONAVIRUS 2 (TAT 6-24 HRS): SARS Coronavirus 2: NEGATIVE

## 2019-09-27 ENCOUNTER — Other Ambulatory Visit: Payer: Self-pay

## 2019-09-27 ENCOUNTER — Encounter (HOSPITAL_BASED_OUTPATIENT_CLINIC_OR_DEPARTMENT_OTHER): Payer: Self-pay | Admitting: Otolaryngology

## 2019-09-27 ENCOUNTER — Encounter (HOSPITAL_BASED_OUTPATIENT_CLINIC_OR_DEPARTMENT_OTHER)
Admission: RE | Disposition: A | Discharge status: Home / Self Care | Payer: Self-pay | Source: Home / Self Care | Attending: Otolaryngology

## 2019-09-27 ENCOUNTER — Ambulatory Visit (HOSPITAL_BASED_OUTPATIENT_CLINIC_OR_DEPARTMENT_OTHER)
Admission: RE | Admit: 2019-09-27 | Discharge: 2019-09-27 | Disposition: A | Discharge status: Home / Self Care | Payer: Medicare HMO | Attending: Otolaryngology | Admitting: Otolaryngology

## 2019-09-27 ENCOUNTER — Ambulatory Visit (HOSPITAL_BASED_OUTPATIENT_CLINIC_OR_DEPARTMENT_OTHER): Payer: Medicare HMO | Admitting: Anesthesiology

## 2019-09-27 DIAGNOSIS — D170 Benign lipomatous neoplasm of skin and subcutaneous tissue of head, face and neck: Secondary | ICD-10-CM | POA: Insufficient documentation

## 2019-09-27 DIAGNOSIS — R221 Localized swelling, mass and lump, neck: Secondary | ICD-10-CM | POA: Diagnosis not present

## 2019-09-27 DIAGNOSIS — M542 Cervicalgia: Secondary | ICD-10-CM | POA: Diagnosis not present

## 2019-09-27 DIAGNOSIS — M1990 Unspecified osteoarthritis, unspecified site: Secondary | ICD-10-CM | POA: Diagnosis not present

## 2019-09-27 HISTORY — DX: Other specified postprocedural states: Z98.890

## 2019-09-27 HISTORY — PX: LIPOMA EXCISION: SHX5283

## 2019-09-27 SURGERY — EXCISION LIPOMA
Anesthesia: General | Site: Neck | Laterality: Left

## 2019-09-27 MED ORDER — EPHEDRINE SULFATE-NACL 50-0.9 MG/10ML-% IV SOSY
PREFILLED_SYRINGE | INTRAVENOUS | Status: DC | PRN
  Administered 2019-09-27 (×4): 10 mg via INTRAVENOUS

## 2019-09-27 MED ORDER — PROPOFOL 10 MG/ML IV BOLUS
INTRAVENOUS | Status: AC
  Filled 2019-09-27: qty 20

## 2019-09-27 MED ORDER — FENTANYL CITRATE (PF) 100 MCG/2ML IJ SOLN
INTRAMUSCULAR | Status: AC
  Filled 2019-09-27: qty 2

## 2019-09-27 MED ORDER — LACTATED RINGERS IV SOLN: INTRAVENOUS | Status: DC

## 2019-09-27 MED ORDER — PROPOFOL 10 MG/ML IV BOLUS
INTRAVENOUS | Status: DC | PRN
  Administered 2019-09-27: 20 mg via INTRAVENOUS
  Administered 2019-09-27: 150 mg via INTRAVENOUS
  Administered 2019-09-27: 30 mg via INTRAVENOUS

## 2019-09-27 MED ORDER — MIDAZOLAM HCL 2 MG/2ML IJ SOLN
INTRAMUSCULAR | Status: AC
  Filled 2019-09-27: qty 2

## 2019-09-27 MED ORDER — SUCCINYLCHOLINE CHLORIDE 200 MG/10ML IV SOSY
PREFILLED_SYRINGE | INTRAVENOUS | Status: DC | PRN
Start: 2019-09-27 — End: 2019-09-27
  Administered 2019-09-27: 120 mg via INTRAVENOUS

## 2019-09-27 MED ORDER — ONDANSETRON HCL 4 MG/2ML IJ SOLN
INTRAMUSCULAR | Status: AC
  Filled 2019-09-27: qty 2

## 2019-09-27 MED ORDER — AMOXICILLIN 875 MG PO TABS: 875.0000 mg | ORAL_TABLET | Freq: Two times a day (BID) | ORAL | 0 refills | Status: AC

## 2019-09-27 MED ORDER — LIDOCAINE-EPINEPHRINE 1 %-1:100000 IJ SOLN
INTRAMUSCULAR | Status: DC | PRN
  Administered 2019-09-27: 3 mL

## 2019-09-27 MED ORDER — FENTANYL CITRATE (PF) 100 MCG/2ML IJ SOLN
INTRAMUSCULAR | Status: DC | PRN
  Administered 2019-09-27 (×2): 50 ug via INTRAVENOUS
  Administered 2019-09-27: 100 ug via INTRAVENOUS

## 2019-09-27 MED ORDER — LIDOCAINE HCL (CARDIAC) PF 100 MG/5ML IV SOSY
PREFILLED_SYRINGE | INTRAVENOUS | Status: DC | PRN
  Administered 2019-09-27: 70 mg via INTRAVENOUS

## 2019-09-27 MED ORDER — LIDOCAINE 2% (20 MG/ML) 5 ML SYRINGE
INTRAMUSCULAR | Status: AC
  Filled 2019-09-27: qty 5

## 2019-09-27 MED ORDER — CEFAZOLIN SODIUM-DEXTROSE 2-3 GM-%(50ML) IV SOLR
INTRAVENOUS | Status: DC | PRN
Start: 2019-09-27 — End: 2019-09-27
  Administered 2019-09-27: 2 g via INTRAVENOUS

## 2019-09-27 MED ORDER — MIDAZOLAM HCL 2 MG/2ML IJ SOLN
INTRAMUSCULAR | Status: DC | PRN
  Administered 2019-09-27: 2 mg via INTRAVENOUS

## 2019-09-27 MED ORDER — DEXAMETHASONE SODIUM PHOSPHATE 10 MG/ML IJ SOLN
INTRAMUSCULAR | Status: AC
  Filled 2019-09-27: qty 1

## 2019-09-27 MED ORDER — SUCCINYLCHOLINE CHLORIDE 200 MG/10ML IV SOSY
PREFILLED_SYRINGE | INTRAVENOUS | Status: AC
  Filled 2019-09-27: qty 10

## 2019-09-27 MED ORDER — ONDANSETRON HCL 4 MG/2ML IJ SOLN
INTRAMUSCULAR | Status: DC | PRN
  Administered 2019-09-27: 4 mg via INTRAVENOUS

## 2019-09-27 MED ORDER — DEXAMETHASONE SODIUM PHOSPHATE 10 MG/ML IJ SOLN
INTRAMUSCULAR | Status: DC | PRN
  Administered 2019-09-27: 8 mg via INTRAVENOUS

## 2019-09-27 SURGICAL SUPPLY — 58 items
ATTRACTOMAT 16X20 MAGNETIC DRP (DRAPES) IMPLANT
BLADE SURG 15 STRL LF DISP TIS (BLADE) ×1 IMPLANT
BLADE SURG 15 STRL SS (BLADE) ×2
CANISTER SUCT 1200ML W/VALVE (MISCELLANEOUS) ×3 IMPLANT
CORD BIPOLAR FORCEPS 12FT (ELECTRODE) IMPLANT
COVER BACK TABLE 60X90IN (DRAPES) ×3 IMPLANT
COVER MAYO STAND STRL (DRAPES) ×3 IMPLANT
COVER WAND RF STERILE (DRAPES) IMPLANT
DECANTER SPIKE VIAL GLASS SM (MISCELLANEOUS) IMPLANT
DERMABOND ADVANCED (GAUZE/BANDAGES/DRESSINGS) ×2
DERMABOND ADVANCED .7 DNX12 (GAUZE/BANDAGES/DRESSINGS) ×1 IMPLANT
DRAIN JACKSON RD 7FR 3/32 (WOUND CARE) IMPLANT
DRAIN JP 10F RND SILICONE (MISCELLANEOUS) IMPLANT
DRAIN PENROSE 1/4X12 LTX STRL (WOUND CARE) IMPLANT
DRAIN TLS ROUND 10FR (DRAIN) IMPLANT
DRAPE U-SHAPE 76X120 STRL (DRAPES) ×3 IMPLANT
ELECT COATED BLADE 2.86 ST (ELECTRODE) ×3 IMPLANT
ELECT PAIRED SUBDERMAL (MISCELLANEOUS) ×3
ELECT REM PT RETURN 9FT ADLT (ELECTROSURGICAL) ×3
ELECTRODE PAIRED SUBDERMAL (MISCELLANEOUS) ×1 IMPLANT
ELECTRODE REM PT RTRN 9FT ADLT (ELECTROSURGICAL) ×1 IMPLANT
EVACUATOR SILICONE 100CC (DRAIN) IMPLANT
GAUZE 4X4 16PLY RFD (DISPOSABLE) IMPLANT
GAUZE SPONGE 4X4 12PLY STRL LF (GAUZE/BANDAGES/DRESSINGS) IMPLANT
GLOVE BIO SURGEON STRL SZ7.5 (GLOVE) ×3 IMPLANT
GLOVE BIOGEL PI IND STRL 6.5 (GLOVE) ×2 IMPLANT
GLOVE BIOGEL PI INDICATOR 6.5 (GLOVE) ×4
GLOVE ECLIPSE 6.5 STRL STRAW (GLOVE) ×3 IMPLANT
GOWN STRL REUS W/ TWL LRG LVL3 (GOWN DISPOSABLE) ×2 IMPLANT
GOWN STRL REUS W/TWL LRG LVL3 (GOWN DISPOSABLE) ×4
HEMOSTAT SNOW SURGICEL 2X4 (HEMOSTASIS) ×3 IMPLANT
NEEDLE HYPO 25X1 1.5 SAFETY (NEEDLE) ×3 IMPLANT
NS IRRIG 1000ML POUR BTL (IV SOLUTION) ×3 IMPLANT
PENCIL SMOKE EVACUATOR (MISCELLANEOUS) ×3 IMPLANT
PIN SAFETY STERILE (MISCELLANEOUS) IMPLANT
PROBE NERVBE PRASS .33 (MISCELLANEOUS) ×3 IMPLANT
SET BASIN DAY SURGERY F.S. (CUSTOM PROCEDURE TRAY) ×3 IMPLANT
SHEARS HARMONIC 9CM CVD (BLADE) ×3 IMPLANT
SPONGE GAUZE 2X2 8PLY STER LF (GAUZE/BANDAGES/DRESSINGS)
SPONGE GAUZE 2X2 8PLY STRL LF (GAUZE/BANDAGES/DRESSINGS) IMPLANT
STAPLER VISISTAT 35W (STAPLE) IMPLANT
SUT CHROMIC 3 0 SH 27 (SUTURE) IMPLANT
SUT ETHILON 3 0 PS 1 (SUTURE) IMPLANT
SUT ETHILON 4 0 PS 2 18 (SUTURE) IMPLANT
SUT ETHILON 5 0 P 3 18 (SUTURE)
SUT NYLON ETHILON 5-0 P-3 1X18 (SUTURE) IMPLANT
SUT SILK 3 0 TIES 17X18 (SUTURE)
SUT SILK 3-0 18XBRD TIE BLK (SUTURE) IMPLANT
SUT SILK 4 0 TIES 17X18 (SUTURE) IMPLANT
SUT VIC AB 4-0 RB1 27 (SUTURE)
SUT VIC AB 4-0 RB1 27X BRD (SUTURE) IMPLANT
SUT VICRYL 4-0 PS2 18IN ABS (SUTURE) ×3 IMPLANT
SYR BULB EAR ULCER 3OZ GRN STR (SYRINGE) ×3 IMPLANT
SYR CONTROL 10ML LL (SYRINGE) ×3 IMPLANT
TOWEL GREEN STERILE FF (TOWEL DISPOSABLE) ×6 IMPLANT
TRAY DSU PREP LF (CUSTOM PROCEDURE TRAY) ×3 IMPLANT
TUBE CONNECTING 20'X1/4 (TUBING) ×1
TUBE CONNECTING 20X1/4 (TUBING) ×2 IMPLANT

## 2019-09-27 NOTE — H&P (Signed)
Cc: Left neck mass  HPI: The patient is a 64 y/o male who presents today for evaluation of a left neck mass. The patient first noted the mass 10+ years ago. He underwent FNA which was not conclusive. The mass was felt to be a lipoma. The patient has had multiple lipomas in the past. The mass has not gotten larger but it does bother the patient when he turns his neck to the left side. The patient denies pain or dysphagia. The patient has a history of right Meniere's disease. He states he has been doing great since limiting his salt intake. No other ENT, GI, or respiratory issue noted since the last visit.   Exam: General: Communicates without difficulty, well nourished, no acute distress. Head: Normocephalic, no evidence injury, no tenderness, facial buttresses intact without stepoff. Eyes: PERRL, EOMI. No scleral icterus, conjunctivae clear. Neuro: CN II exam reveals vision grossly intact. No nystagmus at any point of gaze. Ears: Auricles well formed without lesions. Ear canals are intact without mass or lesion. No erythema or edema is appreciated. The TMs are intact without fluid. Nose: External evaluation reveals normal support and skin without lesions. Dorsum is intact. Anterior rhinoscopy reveals healthy pink mucosa over anterior aspect of inferior turbinates and intact septum. No purulence noted. Oral:  Oral cavity and oropharynx are intact, symmetric, without erythema or edema. Mucosa is moist without lesions. Neck: Full range of motion without pain. There is no significant lymphadenopathy.  A 5.5cm left submandibular mass is noted. Thyroid bed within normal limits to palpation. Parotid glands and submandibular glands equal bilaterally without mass. Trachea is midline. Neuro:  CN 2-12 grossly intact. Gait normal. Vestibular: No nystagmus at any point of gaze. The cerebellar examination is unremarkable.   Assessment 1. A 5.5 cm left neck mass is noted. The appearance is suggestive of a lipoma.  2. The  patient's right Meniere's disease is currently well controlled.   Plan  1. The physical exam findings are reviewed with the patient.  2. Continue 1500 mg low salt diet.  3. Treatment options for his neck mass include observation versus excision. The risks, benefits, alternatives, and details of the procedure are reviewed with the patient. Questions are invited and answered. 4. The patient is interested in proceeding with the procedure.  We will schedule the procedure in accordance with the family schedule.

## 2019-09-27 NOTE — Anesthesia Procedure Notes (Signed)
Procedure Name: Intubation Date/Time: 09/27/2019 10:03 AM Performed by: Raenette Rover, CRNA Pre-anesthesia Checklist: Patient identified, Emergency Drugs available, Suction available and Patient being monitored Patient Re-evaluated:Patient Re-evaluated prior to induction Oxygen Delivery Method: Circle system utilized Preoxygenation: Pre-oxygenation with 100% oxygen Induction Type: IV induction Ventilation: Mask ventilation without difficulty Laryngoscope Size: Mac and 3 Grade View: Grade I Tube type: Oral Tube size: 7.0 mm Number of attempts: 1 Airway Equipment and Method: Stylet Placement Confirmation: ETT inserted through vocal cords under direct vision,  positive ETCO2 and breath sounds checked- equal and bilateral Secured at: 23 cm Tube secured with: Tape Dental Injury: Teeth and Oropharynx as per pre-operative assessment

## 2019-09-27 NOTE — Op Note (Signed)
DATE OF PROCEDURE:  09/27/2019                              OPERATIVE REPORT  SURGEON:  Leta Baptist, MD  PREOPERATIVE DIAGNOSES: 1. Left neck mass.  POSTOPERATIVE DIAGNOSES: 1. Left neck mass  PROCEDURE PERFORMED:  Excision of left neck mass (5cm)  ANESTHESIA:  General endotracheal tube anesthesia.  COMPLICATIONS:  None.  ESTIMATED BLOOD LOSS:  Minimal.  INDICATION FOR PROCEDURE:  Edward Rhodes is a 64 y.o. male with a history of a left neck mass. The patient first noted the mass 10+ years ago. He underwent FNA which was not conclusive. The mass was felt to be a lipoma. The patient has had multiple lipomas in the past. The mass has been bothering the patient when he turned his neck to the left side. Based on the above findings, the decision was made for the patient to undergo the excision procedure. Likelihood of success in reducing symptoms was also discussed.  The risks, benefits, alternatives, and details of the procedure were discussed with the patient.  Questions were invited and answered.  Informed consent was obtained.  DESCRIPTION:  The patient was taken to the operating room and placed supine on the operating table.  General endotracheal tube anesthesia was administered by the anesthesiologist.  The patient was positioned and prepped and draped in a standard fashion for left neck surgery.  Facial nerve monitoring electrodes was placed.  Examination of the neck reveals a 5 cm soft tissue mass inferior to the left mandibular margin.  1% lidocaine with 1-100,000 epinephrine was infiltrated at the planned site of incision.  A transverse incision was made over the left neck mass.  Subplatysmal flaps were elevated in the standard fashion.  Careful dissection was carried out to separate the soft tissue mass from the surrounding structures.  The entire mass was removed and sent to the pathology department for permanent histologic identification.  The surgical site was copiously irrigated.  The  incision was closed in layers with 4-0 Vicryl and Dermabond.  The care of the patient was turned over to the anesthesiologist.  The patient was awakened from anesthesia without difficulty.  The patient was extubated and transferred to the recovery room in good condition.  OPERATIVE FINDINGS: A 5 cm left submandibular mass.  The appearance was consistent with a lipoma.  SPECIMEN: Left neck mass.  FOLLOWUP CARE:  The patient will be discharged home once awake and alert.   The patient will follow up in my office in approximately 1 week.  Edward Rhodes 09/27/2019 11:18 AM

## 2019-09-27 NOTE — Anesthesia Preprocedure Evaluation (Signed)
Anesthesia Evaluation  Patient identified by MRN, date of birth, ID band Patient awake    Reviewed: Allergy & Precautions, NPO status , Patient's Chart, lab work & pertinent test results  History of Anesthesia Complications (+) PONV  Airway Mallampati: II  TM Distance: >3 FB Neck ROM: Full    Dental no notable dental hx.    Pulmonary neg pulmonary ROS,    Pulmonary exam normal breath sounds clear to auscultation       Cardiovascular negative cardio ROS Normal cardiovascular exam Rhythm:Regular Rate:Normal     Neuro/Psych negative neurological ROS  negative psych ROS   GI/Hepatic negative GI ROS, Neg liver ROS,   Endo/Other  negative endocrine ROS  Renal/GU negative Renal ROS  negative genitourinary   Musculoskeletal  (+) Arthritis , Osteoarthritis,    Abdominal   Peds negative pediatric ROS (+)  Hematology negative hematology ROS (+)   Anesthesia Other Findings   Reproductive/Obstetrics negative OB ROS                             Anesthesia Physical Anesthesia Plan  ASA: II  Anesthesia Plan: General   Post-op Pain Management:    Induction: Intravenous  PONV Risk Score and Plan: 3 and Ondansetron, Dexamethasone, Midazolam and Treatment may vary due to age or medical condition  Airway Management Planned: Oral ETT  Additional Equipment:   Intra-op Plan:   Post-operative Plan: Extubation in OR  Informed Consent: I have reviewed the patients History and Physical, chart, labs and discussed the procedure including the risks, benefits and alternatives for the proposed anesthesia with the patient or authorized representative who has indicated his/her understanding and acceptance.     Dental advisory given  Plan Discussed with: CRNA  Anesthesia Plan Comments:         Anesthesia Quick Evaluation

## 2019-09-27 NOTE — Discharge Instructions (Signed)
  Post Anesthesia Home Care Instructions  Activity: Get plenty of rest for the remainder of the day. A responsible individual must stay with you for 24 hours following the procedure.  For the next 24 hours, DO NOT: -Drive a car -Paediatric nurse -Drink alcoholic beverages -Take any medication unless instructed by your physician -Make any legal decisions or sign important papers.  Meals: Start with liquid foods such as gelatin or soup. Progress to regular foods as tolerated. Avoid greasy, spicy, heavy foods. If nausea and/or vomiting occur, drink only clear liquids until the nausea and/or vomiting subsides. Call your physician if vomiting continues.  Special Instructions/Symptoms: Your throat may feel dry or sore from the anesthesia or the breathing tube placed in your throat during surgery. If this causes discomfort, gargle with warm salt water. The discomfort should disappear within 24 hours.  --------------  The patient may resume all his previous activities and diet.  He will follow-up in my office in 1 week.

## 2019-09-27 NOTE — Anesthesia Postprocedure Evaluation (Signed)
Anesthesia Post Note  Patient: Edward Rhodes  Procedure(s) Performed: EXCISION NECK LIPOMA (Left Neck)     Patient location during evaluation: PACU Anesthesia Type: General Level of consciousness: awake and alert Pain management: pain level controlled Vital Signs Assessment: post-procedure vital signs reviewed and stable Respiratory status: spontaneous breathing, nonlabored ventilation and respiratory function stable Cardiovascular status: blood pressure returned to baseline and stable Postop Assessment: no apparent nausea or vomiting Anesthetic complications: no   No complications documented.  Last Vitals:  Vitals:   09/27/19 1130 09/27/19 1207  BP: 122/83 130/75  Pulse: 64 62  Resp: 12 16  Temp:  36.4 C  SpO2: 98% 98%    Last Pain:  Vitals:   09/27/19 1207  TempSrc: Oral  PainSc: 0-No pain                 Lynda Rainwater

## 2019-09-27 NOTE — Transfer of Care (Signed)
Immediate Anesthesia Transfer of Care Note  Patient: Edward Rhodes  Procedure(s) Performed: EXCISION NECK LIPOMA (Left Neck)  Patient Location: PACU  Anesthesia Type:General  Level of Consciousness: drowsy and patient cooperative  Airway & Oxygen Therapy: Patient Spontanous Breathing and Patient connected to face mask oxygen  Post-op Assessment: Report given to RN and Post -op Vital signs reviewed and stable  Post vital signs: Reviewed and stable  Last Vitals:  Vitals Value Taken Time  BP 119/75   Temp    Pulse 64   Resp    SpO2 98     Last Pain:  Vitals:   09/27/19 0844  TempSrc: Oral  PainSc: 0-No pain         Complications: No complications documented.

## 2019-09-28 ENCOUNTER — Encounter (HOSPITAL_BASED_OUTPATIENT_CLINIC_OR_DEPARTMENT_OTHER): Payer: Self-pay | Admitting: Otolaryngology

## 2019-09-28 LAB — SURGICAL PATHOLOGY

## 2019-10-13 DIAGNOSIS — M19019 Primary osteoarthritis, unspecified shoulder: Secondary | ICD-10-CM | POA: Diagnosis not present

## 2019-10-13 DIAGNOSIS — E875 Hyperkalemia: Secondary | ICD-10-CM | POA: Diagnosis not present

## 2019-10-13 DIAGNOSIS — E782 Mixed hyperlipidemia: Secondary | ICD-10-CM | POA: Diagnosis not present

## 2019-10-13 DIAGNOSIS — F331 Major depressive disorder, recurrent, moderate: Secondary | ICD-10-CM | POA: Diagnosis not present

## 2019-10-13 DIAGNOSIS — R7301 Impaired fasting glucose: Secondary | ICD-10-CM | POA: Diagnosis not present

## 2019-10-13 DIAGNOSIS — E7849 Other hyperlipidemia: Secondary | ICD-10-CM | POA: Diagnosis not present

## 2019-10-27 IMAGING — MR MR HEAD WO/W CM
6 of 13 series · 28 of 48 positions shown · IV contrast (7ml Multihance)
Comparison: CT head 12/28/2017.

CLINICAL DATA: RIGHT serous otitis diagnosed in the emergency room
on 12/28/2017, also history given of LEFT sensorineural hearing
loss.

EXAM:
MRI HEAD WITHOUT AND WITH CONTRAST
TECHNIQUE: Multiplanar, multiecho pulse sequences of the brain and surrounding
structures were obtained without and with intravenous contrast.
CONTRAST:  Gadavist 7 mL.

[Series 3: DWI · axial · 3.0mm · 0.82mm/px · z∈[-114,+48]mm · 5 of 55 slices shown (1 of 4)]
[im 1/55]
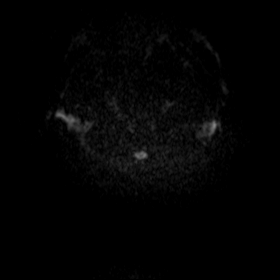
[im 14/55]
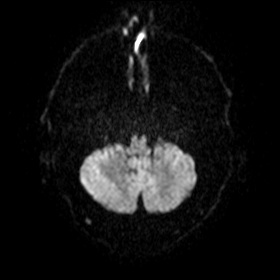
[im 28/55]
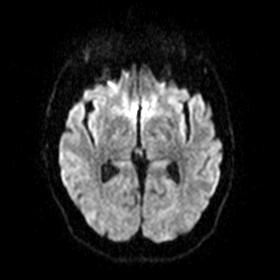
[im 41/55]
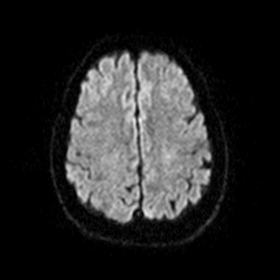
[im 55/55]
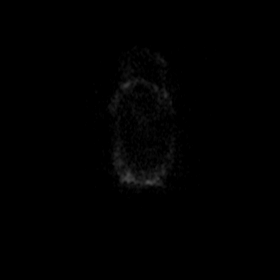

[Series 4: DWI · axial · 3.0mm · 0.82mm/px · z∈[-114,+48]mm · 5 of 48 slices shown (2 of 4)]
[im 1/48]
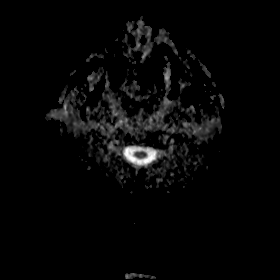
[im 12/48]
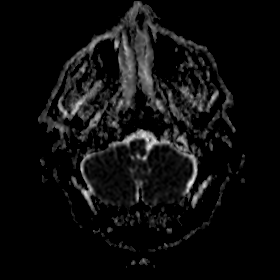
[im 24/48]
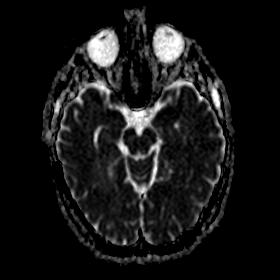
[im 36/48]
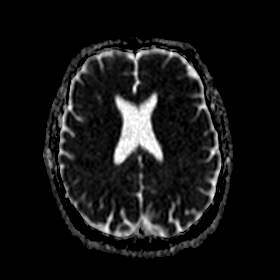
[im 48/48]
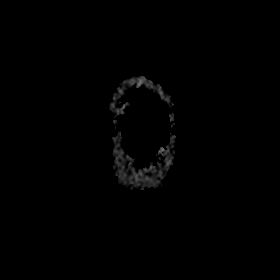

[Series 5: DWI · coronal · 5.0mm · 0.52mm/px · 3 of 34 slices shown (3 of 4)]
[im 1/34]
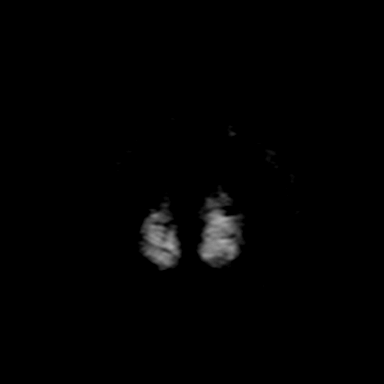
[im 17/34]
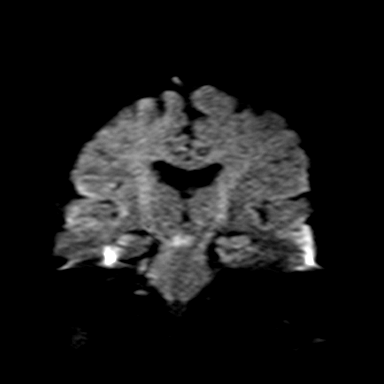
[im 34/34]
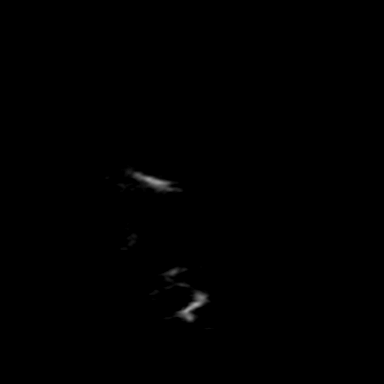

[Series 6: DWI · coronal · 5.0mm · 0.52mm/px · 3 of 34 slices shown (4 of 4)]
[im 1/34]
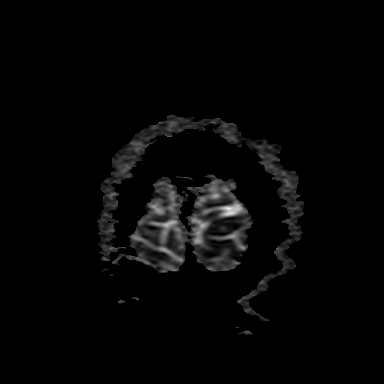
[im 17/34]
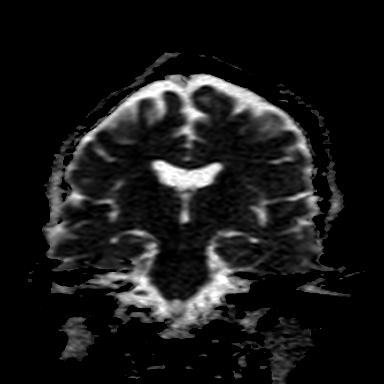
[im 34/34]
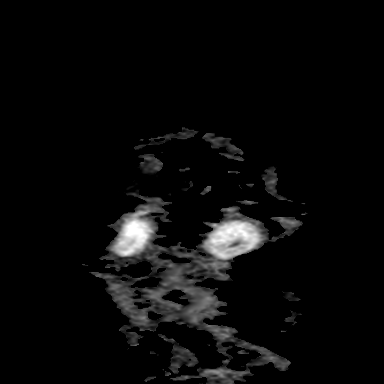

[Series 8: FLAIR · axial · 3.0mm · 0.35mm/px · z∈[-97,+40]mm · 5 of 47 slices shown]
[im 1/47]
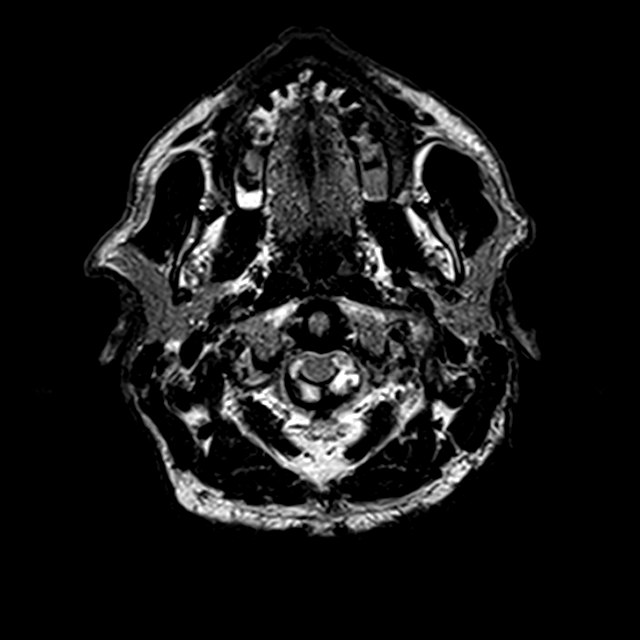
[im 12/47]
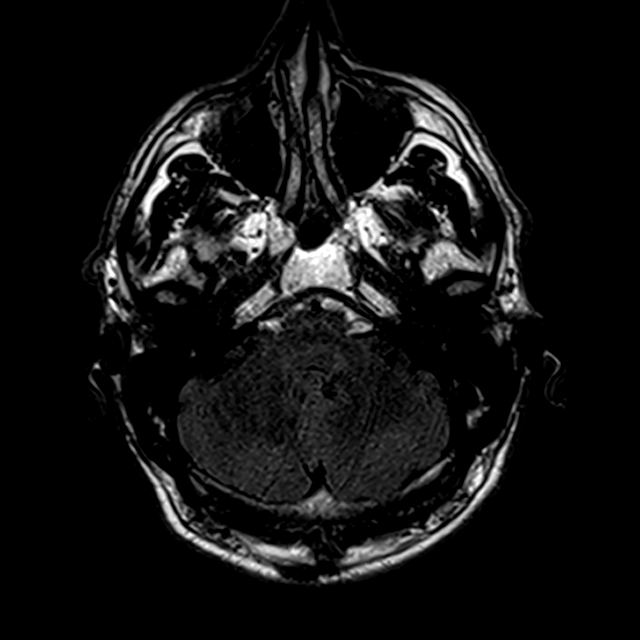
[im 24/47]
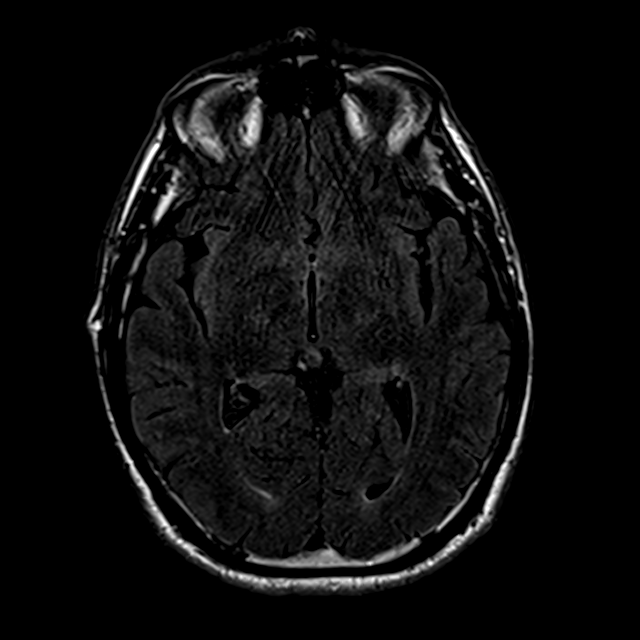
[im 35/47]
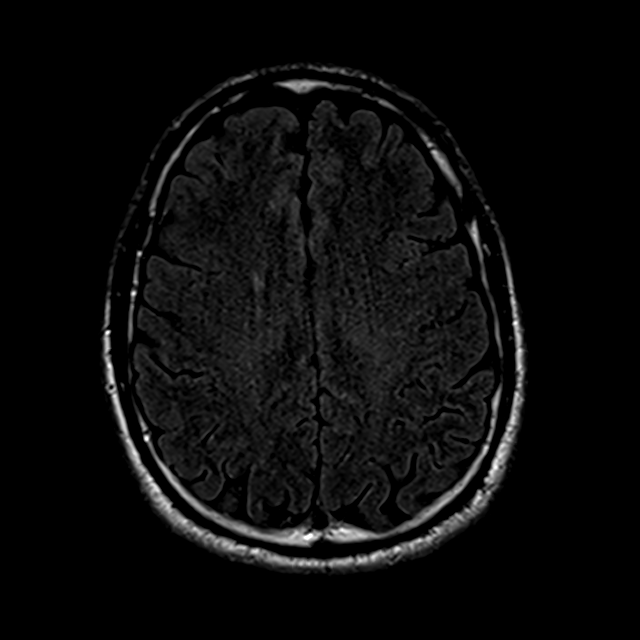
[im 47/47]
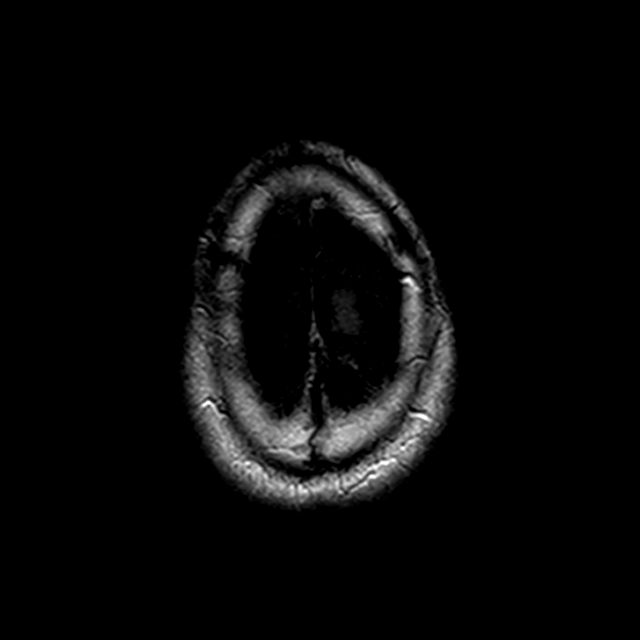

[Series 15: T1 post-contrast · axial · 2.0mm · 0.45mm/px · z∈[-112,+51]mm · 7 of 95 slices shown]
[im 1/95]
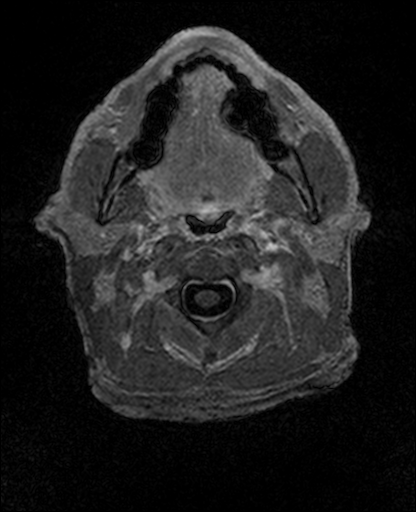
[im 12/95]
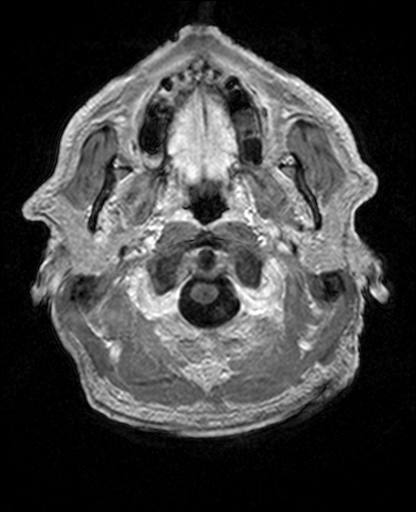
[im 24/95]
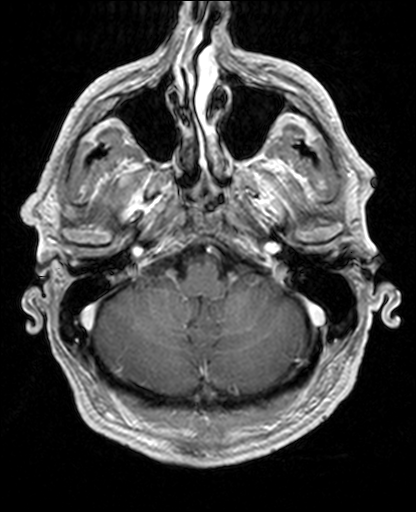
[im 36/95]
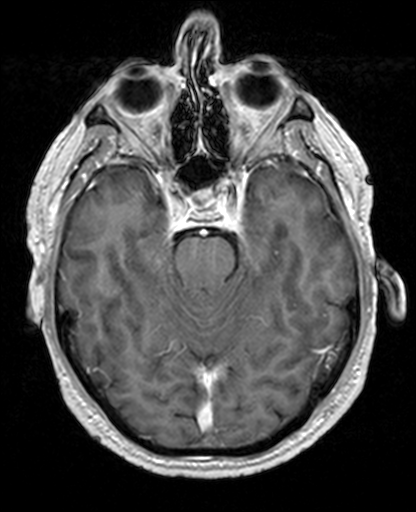
[im 59/95]
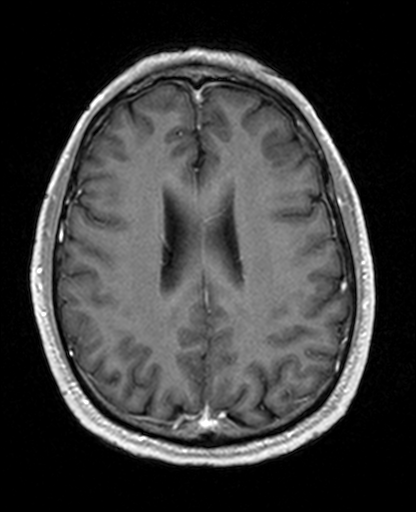
[im 71/95]
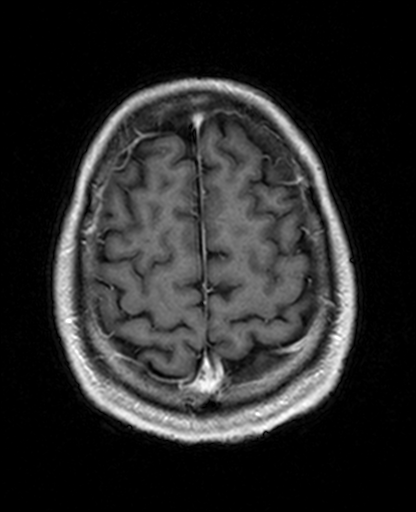
[im 83/95]
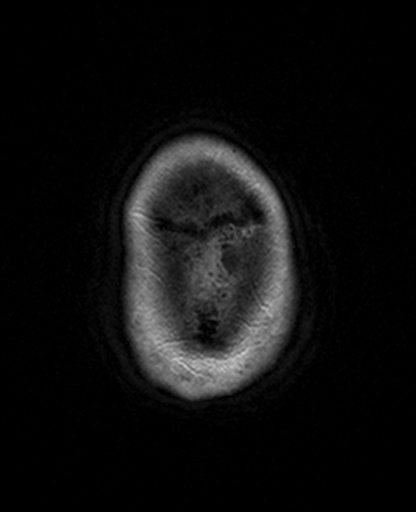

[28 of 48 positions shown; findings below may reference images not displayed]

FINDINGS: Brain: No evidence for acute infarction, hemorrhage, mass lesion,
hydrocephalus, or extra-axial fluid. Normal cerebral volume. No
significant white matter disease.

Post infusion, no abnormal enhancement of the brain or meninges.

Thin-section imaging was performed through the posterior fossa pre
and postcontrast. No evidence for vestibular schwannoma, posterior
fossa mass, temporal bone inflammatory process, or vascular loop. No
significant middle ear or mastoid fluid is evident.

Vascular: Normal flow voids.

Skull and upper cervical spine: Normal marrow signal. Slight
low-lying cerebellar tonsils, 4-5 mm below the foramen magnum,
without impaction, doubtful significance.

Sinuses/Orbits: Negative.

Other: None.

Compared with recent CT, no significant interval change.
IMPRESSION: MRI of the brain demonstrates no cause for either conductive or
sensorineural hearing loss.

Slight low-lying cerebellar tonsils, non worrisome.

Normal cerebral volume without significant white matter disease. No
abnormal postcontrast enhancement of the brain or meninges.

## 2019-11-15 DIAGNOSIS — F331 Major depressive disorder, recurrent, moderate: Secondary | ICD-10-CM | POA: Diagnosis not present

## 2019-11-15 DIAGNOSIS — E875 Hyperkalemia: Secondary | ICD-10-CM | POA: Diagnosis not present

## 2019-11-15 DIAGNOSIS — E7849 Other hyperlipidemia: Secondary | ICD-10-CM | POA: Diagnosis not present

## 2019-11-15 DIAGNOSIS — E782 Mixed hyperlipidemia: Secondary | ICD-10-CM | POA: Diagnosis not present

## 2019-11-15 DIAGNOSIS — M19019 Primary osteoarthritis, unspecified shoulder: Secondary | ICD-10-CM | POA: Diagnosis not present

## 2019-11-15 DIAGNOSIS — R7301 Impaired fasting glucose: Secondary | ICD-10-CM | POA: Diagnosis not present

## 2019-12-21 DIAGNOSIS — M19019 Primary osteoarthritis, unspecified shoulder: Secondary | ICD-10-CM | POA: Diagnosis not present

## 2019-12-21 DIAGNOSIS — E782 Mixed hyperlipidemia: Secondary | ICD-10-CM | POA: Diagnosis not present

## 2019-12-21 DIAGNOSIS — F331 Major depressive disorder, recurrent, moderate: Secondary | ICD-10-CM | POA: Diagnosis not present

## 2019-12-21 DIAGNOSIS — E7849 Other hyperlipidemia: Secondary | ICD-10-CM | POA: Diagnosis not present

## 2019-12-21 DIAGNOSIS — E875 Hyperkalemia: Secondary | ICD-10-CM | POA: Diagnosis not present

## 2019-12-21 DIAGNOSIS — R7301 Impaired fasting glucose: Secondary | ICD-10-CM | POA: Diagnosis not present

## 2020-01-17 DIAGNOSIS — J310 Chronic rhinitis: Secondary | ICD-10-CM | POA: Diagnosis not present

## 2020-01-17 DIAGNOSIS — J342 Deviated nasal septum: Secondary | ICD-10-CM | POA: Diagnosis not present

## 2020-01-23 DIAGNOSIS — F331 Major depressive disorder, recurrent, moderate: Secondary | ICD-10-CM | POA: Diagnosis not present

## 2020-01-23 DIAGNOSIS — E7849 Other hyperlipidemia: Secondary | ICD-10-CM | POA: Diagnosis not present

## 2020-01-23 DIAGNOSIS — R7301 Impaired fasting glucose: Secondary | ICD-10-CM | POA: Diagnosis not present

## 2020-01-23 DIAGNOSIS — E782 Mixed hyperlipidemia: Secondary | ICD-10-CM | POA: Diagnosis not present

## 2020-01-23 DIAGNOSIS — E875 Hyperkalemia: Secondary | ICD-10-CM | POA: Diagnosis not present

## 2020-01-23 DIAGNOSIS — M19019 Primary osteoarthritis, unspecified shoulder: Secondary | ICD-10-CM | POA: Diagnosis not present

## 2020-01-24 DIAGNOSIS — H6504 Acute serous otitis media, recurrent, right ear: Secondary | ICD-10-CM | POA: Diagnosis not present

## 2020-01-24 DIAGNOSIS — Z0000 Encounter for general adult medical examination without abnormal findings: Secondary | ICD-10-CM | POA: Diagnosis not present

## 2020-01-24 DIAGNOSIS — F321 Major depressive disorder, single episode, moderate: Secondary | ICD-10-CM | POA: Diagnosis not present

## 2020-01-24 DIAGNOSIS — Z6821 Body mass index (BMI) 21.0-21.9, adult: Secondary | ICD-10-CM | POA: Diagnosis not present

## 2020-01-24 DIAGNOSIS — H6090 Unspecified otitis externa, unspecified ear: Secondary | ICD-10-CM | POA: Diagnosis not present

## 2020-01-24 DIAGNOSIS — F331 Major depressive disorder, recurrent, moderate: Secondary | ICD-10-CM | POA: Diagnosis not present

## 2020-01-24 DIAGNOSIS — F5101 Primary insomnia: Secondary | ICD-10-CM | POA: Diagnosis not present

## 2020-01-24 DIAGNOSIS — E7849 Other hyperlipidemia: Secondary | ICD-10-CM | POA: Diagnosis not present

## 2020-01-24 DIAGNOSIS — R1110 Vomiting, unspecified: Secondary | ICD-10-CM | POA: Diagnosis not present

## 2020-01-24 DIAGNOSIS — E782 Mixed hyperlipidemia: Secondary | ICD-10-CM | POA: Diagnosis not present

## 2020-01-24 DIAGNOSIS — S30860A Insect bite (nonvenomous) of lower back and pelvis, initial encounter: Secondary | ICD-10-CM | POA: Diagnosis not present

## 2020-02-21 DIAGNOSIS — E875 Hyperkalemia: Secondary | ICD-10-CM | POA: Diagnosis not present

## 2020-02-21 DIAGNOSIS — E7849 Other hyperlipidemia: Secondary | ICD-10-CM | POA: Diagnosis not present

## 2020-02-21 DIAGNOSIS — F331 Major depressive disorder, recurrent, moderate: Secondary | ICD-10-CM | POA: Diagnosis not present

## 2020-02-21 DIAGNOSIS — E782 Mixed hyperlipidemia: Secondary | ICD-10-CM | POA: Diagnosis not present

## 2020-02-21 DIAGNOSIS — M19019 Primary osteoarthritis, unspecified shoulder: Secondary | ICD-10-CM | POA: Diagnosis not present

## 2020-02-21 DIAGNOSIS — R7301 Impaired fasting glucose: Secondary | ICD-10-CM | POA: Diagnosis not present

## 2020-02-22 DIAGNOSIS — H16223 Keratoconjunctivitis sicca, not specified as Sjogren's, bilateral: Secondary | ICD-10-CM | POA: Diagnosis not present

## 2020-04-13 DIAGNOSIS — E875 Hyperkalemia: Secondary | ICD-10-CM | POA: Diagnosis not present

## 2020-04-13 DIAGNOSIS — F331 Major depressive disorder, recurrent, moderate: Secondary | ICD-10-CM | POA: Diagnosis not present

## 2020-04-13 DIAGNOSIS — E782 Mixed hyperlipidemia: Secondary | ICD-10-CM | POA: Diagnosis not present

## 2020-04-13 DIAGNOSIS — M19019 Primary osteoarthritis, unspecified shoulder: Secondary | ICD-10-CM | POA: Diagnosis not present

## 2020-04-13 DIAGNOSIS — R7301 Impaired fasting glucose: Secondary | ICD-10-CM | POA: Diagnosis not present

## 2020-04-13 DIAGNOSIS — E7849 Other hyperlipidemia: Secondary | ICD-10-CM | POA: Diagnosis not present

## 2020-06-11 DIAGNOSIS — E782 Mixed hyperlipidemia: Secondary | ICD-10-CM | POA: Diagnosis not present

## 2020-06-11 DIAGNOSIS — R7301 Impaired fasting glucose: Secondary | ICD-10-CM | POA: Diagnosis not present

## 2020-06-11 DIAGNOSIS — E875 Hyperkalemia: Secondary | ICD-10-CM | POA: Diagnosis not present

## 2020-06-11 DIAGNOSIS — E7849 Other hyperlipidemia: Secondary | ICD-10-CM | POA: Diagnosis not present

## 2020-06-11 DIAGNOSIS — F331 Major depressive disorder, recurrent, moderate: Secondary | ICD-10-CM | POA: Diagnosis not present

## 2020-07-02 ENCOUNTER — Emergency Department (HOSPITAL_COMMUNITY)
Admission: EM | Admit: 2020-07-02 | Discharge: 2020-07-02 | Disposition: A | Discharge status: Home / Self Care | Payer: Medicare HMO | Attending: Emergency Medicine | Admitting: Emergency Medicine

## 2020-07-02 ENCOUNTER — Encounter (HOSPITAL_COMMUNITY): Payer: Self-pay | Admitting: *Deleted

## 2020-07-02 DIAGNOSIS — Z23 Encounter for immunization: Secondary | ICD-10-CM | POA: Insufficient documentation

## 2020-07-02 DIAGNOSIS — S61211A Laceration without foreign body of left index finger without damage to nail, initial encounter: Secondary | ICD-10-CM | POA: Diagnosis not present

## 2020-07-02 DIAGNOSIS — Y92007 Garden or yard of unspecified non-institutional (private) residence as the place of occurrence of the external cause: Secondary | ICD-10-CM | POA: Diagnosis not present

## 2020-07-02 DIAGNOSIS — W268XXA Contact with other sharp object(s), not elsewhere classified, initial encounter: Secondary | ICD-10-CM | POA: Insufficient documentation

## 2020-07-02 DIAGNOSIS — Z8546 Personal history of malignant neoplasm of prostate: Secondary | ICD-10-CM | POA: Insufficient documentation

## 2020-07-02 DIAGNOSIS — Y93H3 Activity, building and construction: Secondary | ICD-10-CM | POA: Diagnosis not present

## 2020-07-02 DIAGNOSIS — S6992XA Unspecified injury of left wrist, hand and finger(s), initial encounter: Secondary | ICD-10-CM | POA: Diagnosis present

## 2020-07-02 DIAGNOSIS — S61213A Laceration without foreign body of left middle finger without damage to nail, initial encounter: Secondary | ICD-10-CM

## 2020-07-02 MED ORDER — TETANUS-DIPHTH-ACELL PERTUSSIS 5-2.5-18.5 LF-MCG/0.5 IM SUSY
0.5000 mL | PREFILLED_SYRINGE | Freq: Once | INTRAMUSCULAR | Status: AC
  Administered 2020-07-02: 0.5 mL via INTRAMUSCULAR
  Filled 2020-07-02: qty 0.5

## 2020-07-02 NOTE — ED Notes (Signed)
Entered room and introduced self to patient. Pt appears to be resting in bed, respirations are even and unlabored with equal chest rise and fall. Bed is locked in the lowest position, side rails x2, call bell within reach. Pt educated on call light use and hourly rounding, verbalized understanding and in agreement at this time. All questions and concerns voiced addressed. Refreshments offered and provided per patient request.  

## 2020-07-02 NOTE — ED Notes (Signed)
ED Provider at bedside. 

## 2020-07-02 NOTE — ED Provider Notes (Signed)
[facility] EMERGENCY DEPARTMENT Provider Note   CSN: [id number] Arrival date & time: 07/02/20  1211     History Chief Complaint  Patient presents with   Laceration    Edward Rhodes is a 65 y.o. male presents to the ED for evaluation of wounds in his left index and middle fingers that occurred immediately prior to arrival.  Patient was building a dam in his backyard with heavy rocks when he accidentally "sliced" the finger pads of both fingers.  States there was never a crush injury.  Reports significant bleeding that he could not even look at his wound so he presented to the ED.  Did not wash his wounds.  Has held pressure while he has been in the ED for 1+ hours and states the bleeding has not resolved and feels like he just needs a Band-Aid.  Denies any other injury.  Denies distal tingling or numbness.  He is not on any anticoagulants.  He does not remember his last tetanus shot.  No interventions.  No modifying factors.  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 Routine screening 2016.    COLONOSCOPY N/A 10/13/2014   Procedure: COLONOSCOPY;  Surgeon: Danie Binder, MD;  [facility];  Service: Endoscopy;  Laterality: N/A;  [place] Left 09/27/2019   Procedure: EXCISION NECK LIPOMA;  Surgeon: Leta Baptist, MD;  Location: [place];  Service: ENT;  Laterality: Left;   PROSTATECTOMY  2008   WRIST SURGERY         Family History  Problem Relation Age of Onset   Heart disease Father    Dementia Father    Prostate cancer Father    Drug abuse Son    Dementia Mother    Breast cancer Sister    Colon cancer Neg Hx    Colon polyps Neg Hx     Social History   Tobacco Use   Smoking status: Never Smoker   Smokeless tobacco: Never Used  Substance Use Topics   Alcohol use: Yes    Comment: 2-3 glasses wine once per week, h/o heavy drinking 25 years ago   Drug use: Yes    Types: Marijuana    Comment: couple times a week     Home Medications Prior to Admission medications   Medication Sig Start Date End Date Taking? Authorizing Provider  buPROPion HCl (WELLBUTRIN SR PO) Take by mouth.    [provider]  ibuprofen (ADVIL,MOTRIN) 200 MG tablet Take 400 mg by mouth every 8 (eight) hours as needed for mild pain.    [provider]  Allergies    Aleve [naproxen sodium]  Review of Systems   Review of Systems  Skin: Positive for wound.  All other systems reviewed and are negative.   Physical Exam Updated Vital Signs BP 124/90    Pulse 67    Temp 97.7 F (36.5 C) (Oral)    Resp 20    Ht 5\' 11"  (1.803 m)    Wt 70.3 kg    SpO2 99%    BMI 21.62 kg/m   Physical Exam Constitutional:      Appearance: He is well-developed.  HENT:     Head: Normocephalic.     Nose: Nose normal.  Eyes:     General: Lids are normal.  Cardiovascular:     Rate and Rhythm: Normal rate.  Pulmonary:     Effort: Pulmonary effort is normal. No respiratory distress.  Musculoskeletal:        General: Normal range of motion.     Cervical back: Normal range of motion.  Skin:    Findings: Laceration present.     Comments: 1 cm straight laceration  with edges well approximated, hemostatic locally tender at finger pad of left index finger. Fingernail intact. Full ROM of this finger. No subungal hematoma. Sensation to light touch intact. Good cap refill  1 cm straight laceration with edges well approximated hemostatic, locally tender at finger pad of left middle finger. Fingernail intact. Full ROM of this finger. No subungal hematoma. Sensation to light touch intact. Good cap refill  Neurological:     Mental Status: He is alert.  Psychiatric:        Behavior: Behavior normal.     ED Results / Procedures / Treatments   Labs (all labs ordered are listed, but only abnormal results are displayed) Labs Reviewed - No data to display  EKG None  Radiology No results found.  Procedures Procedures   Medications Ordered in ED Medications  Tdap (BOOSTRIX) injection 0.5 mL (has no administration in time range)    ED Course  I have reviewed the triage vital signs and the nursing notes.  Pertinent labs & imaging results that were available during my care of the patient were reviewed by me and considered in my medical decision making (see chart for details).    MDM Rules/Calculators/A&P                          Wounds cleaned/irrigated here in the ER by RN. Tetanus updated. No crush injury, no indication for imaging as fracture unlikely. Wounds are small, well approximated and hemostatic not requiring laceration repair here. Dressing applied. Wound care instructions discussed with patient. Return precautions given. Patient and wife in agreement with ER POC and discharge.   Final Clinical Impression(s) / ED Diagnoses Final diagnoses:  Laceration of left index finger without foreign body without damage to nail, initial encounter  Laceration of left middle finger without foreign body without damage to nail, initial encounter    Rx / DC Orders ED Discharge Orders    None       Kinnie Feil, PA-C 07/02/20 1329     Sherwood Gambler, MD 07/03/20 0930

## 2020-07-02 NOTE — ED Triage Notes (Signed)
Laceration to left long finger, cut on a rock

## 2020-07-02 NOTE — Discharge Instructions (Signed)
Keep dressing on for at least 12-24 hr to avoid disturbing clotting within wound. May change dressing if soaked with blood or dirty.  Rinse wound with clean water and antibacterial soap at least morning and evening. Apply antibiotic ointment morning and evening. Re-apply dressing to keep clean and protected.   Continue to wear dressing until wound has scabbed over and no longer oozing blood  Ibuprofen or acetaminophen for pain as needed  Return for signs of infection such as increased pain, swelling, redness, warmth, pus, fevers

## 2020-07-14 ENCOUNTER — Emergency Department (HOSPITAL_COMMUNITY)
Admission: EM | Admit: 2020-07-14 | Discharge: 2020-07-14 | Disposition: A | Discharge status: Home / Self Care | Payer: Medicare HMO | Attending: Emergency Medicine | Admitting: Emergency Medicine

## 2020-07-14 ENCOUNTER — Encounter (HOSPITAL_COMMUNITY): Payer: Self-pay

## 2020-07-14 ENCOUNTER — Other Ambulatory Visit: Payer: Self-pay

## 2020-07-14 DIAGNOSIS — S0531XA Ocular laceration without prolapse or loss of intraocular tissue, right eye, initial encounter: Secondary | ICD-10-CM

## 2020-07-14 DIAGNOSIS — S01111A Laceration without foreign body of right eyelid and periocular area, initial encounter: Secondary | ICD-10-CM | POA: Diagnosis not present

## 2020-07-14 DIAGNOSIS — S0591XA Unspecified injury of right eye and orbit, initial encounter: Secondary | ICD-10-CM | POA: Diagnosis present

## 2020-07-14 DIAGNOSIS — Z8546 Personal history of malignant neoplasm of prostate: Secondary | ICD-10-CM | POA: Insufficient documentation

## 2020-07-14 DIAGNOSIS — W228XXA Striking against or struck by other objects, initial encounter: Secondary | ICD-10-CM | POA: Diagnosis not present

## 2020-07-14 MED ORDER — TETRACAINE HCL 0.5 % OP SOLN
1.0000 [drp] | Freq: Once | OPHTHALMIC | Status: AC
  Administered 2020-07-14: 1 [drp] via OPHTHALMIC
  Filled 2020-07-14: qty 4

## 2020-07-14 MED ORDER — OFLOXACIN 0.3 % OP SOLN: 1.0000 [drp] | Freq: Four times a day (QID) | OPHTHALMIC | 0 refills | Status: AC

## 2020-07-14 MED ORDER — FLUORESCEIN SODIUM 1 MG OP STRP
1.0000 | ORAL_STRIP | Freq: Once | OPHTHALMIC | Status: AC
  Administered 2020-07-14: 1 via OPHTHALMIC
  Filled 2020-07-14: qty 1

## 2020-07-14 NOTE — Discharge Instructions (Signed)
Come back to the emergency department immediately for severe or worsening pain or changes in vision

## 2020-07-14 NOTE — ED Triage Notes (Signed)
Pt presents to ED with injury to right eye after hitting it with black head tool this morning.

## 2020-07-14 NOTE — ED Provider Notes (Signed)
[place] Provider Note   CSN: [id number] Arrival date & time: 07/14/20  1017     History Chief Complaint  Patient presents with  . Eye Injury    Edward Rhodes is a 65 y.o. male.  HPI   This is an otherwise well-appearing 65 year old male, was using a blackhead tool on his face this morning when it slipped and poked his right eye.  He had some bleeding from the conjunctive a, the bleeding is now stopped but he has a subconjunctival hemorrhage.  There is no changes in his vision, the pain is mild persistent, no associated vomiting  Past Medical History:  Diagnosis Date  . Cancer [facility])    prostate  . Cervical radiculopathy   . PONV (postoperative nausea and vomiting)     Patient Active Problem List   Diagnosis Date Noted  . Encounter for screening colonoscopy 08/17/2013  . 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 Abdominal aortic ectasia ([place]) 06/06/2013  . Marijuana smoker 05/06/2013  . Osteoarthritis 05/06/2013  . Alcohol consumption of one to four drinks per day 05/06/2013  . Decreased hearing 05/06/2013  . Nausea alone 05/06/2013  . Loss of weight 05/06/2013  . Cervical radiculopathy   . Cancer [facility])     Past Surgical History:  Procedure Laterality Date  . BACK SURGERY    . COLONOSCOPY  Dec 2006   [place]: descending colon scattered diverticula, 74mm AVM in transverse colon, no polyps. Routine screening 2016.   [place] [place] COLONOSCOPY N/A 10/13/2014   Procedure: COLONOSCOPY;  Surgeon: Danie Binder, MD;  [facility];  Service: Endoscopy;  Laterality: N/A;  830  . HYDROCELE EXCISION    . LIPOMA EXCISION Left 09/27/2019   Procedure: EXCISION NECK LIPOMA;  Surgeon: Leta Baptist, MD;  Location: [place];  Service: ENT;  Laterality: Left;  . PROSTATECTOMY  2008  . WRIST SURGERY         Family History  Problem Relation Age of Onset  . Heart disease Father   . Dementia Father   . Prostate cancer Father   . Drug abuse Son    . Dementia Mother   . Breast cancer Sister   . Colon cancer Neg Hx   . Colon polyps Neg Hx     Social History   Tobacco Use  . Smoking status: Never Smoker  . Smokeless tobacco: Never Used  Substance Use Topics  . Alcohol use: Yes    Comment: 2-3 glasses wine once per week, h/o heavy drinking 25 years ago  . Drug use: Yes    Types: Marijuana    Comment: couple times a week     Home Medications Prior to Admission medications   Medication Sig Start Date End Date Taking? Authorizing Provider  ofloxacin (OCUFLOX) 0.3 % ophthalmic solution Place 1 drop into the right eye 4 (four) times daily for 5 days. 07/14/20 07/19/20 Yes Noemi Chapel, MD  buPROPion HCl [facility] SR PO) Take by mouth.    [provider]  ibuprofen (ADVIL,MOTRIN) 200 MG tablet Take 400 mg by mouth every 8 (eight) hours as needed for mild pain.    [provider]    Allergies    Aleve [naproxen sodium]  Review of Systems   Review of Systems  Eyes: Positive for pain and redness. Negative for visual disturbance.  Gastrointestinal: Negative for vomiting.    Physical Exam Updated Vital Signs BP (!) 178/93 (BP Location: Right Arm)   Pulse [facility] Kitchen)  52   Temp 98.6 F (37 C) (Oral)   Resp 20   Ht 1.803 m (5\' 11" )   Wt 70.3 kg   SpO2 99%   BMI 21.62 kg/m   Physical Exam Vitals and nursing note reviewed.  Constitutional:      Appearance: He is well-developed. He is not diaphoretic.  HENT:     Head: Normocephalic and atraumatic.  Eyes:     General:        Right eye: No discharge.        Left eye: No discharge.     Conjunctiva/sclera: Conjunctivae normal.     Comments: Subconjunctival hemorrhage on the right eye.  There is no obvious injury to the cornea on visual exam, normal appearing posterior retinal exam, normal extraocular movements, normal periorbital tissues.  There is no active bleeding  Pulmonary:     Effort: Pulmonary effort is normal. No respiratory distress.  Skin:     General: Skin is warm and dry.     Findings: No erythema or rash.  Neurological:     Mental Status: He is alert.     Coordination: Coordination normal.     ED Results / Procedures / Treatments   Labs (all labs ordered are listed, but only abnormal results are displayed) Labs Reviewed - No data to display  EKG None  Radiology No results found.  Procedures Procedures   Medications Ordered in ED Medications  tetracaine (PONTOCAINE) 0.5 % ophthalmic solution 1 drop (1 drop Both Eyes Given by Other 07/14/20 1104)  fluorescein ophthalmic strip 1 strip (1 strip Both Eyes Given by Other 07/14/20 1103)    ED Course  I have reviewed the triage vital signs and the nursing notes.  Pertinent labs & imaging results that were available during my care of the patient were reviewed by me and considered in my medical decision making (see chart for details).    MDM Rules/Calculators/A&P                           Fluorescein and tetracaine exam shows that there is a very small conjunctival laceration on the temporal side of the cornea, there is no corneal involvement at all, no active bleeding, no drainage of any fluid or bleeding from the wound  Ofloxacin, follow-up, patient agreeable  Final Clinical Impression(s) / ED Diagnoses Final diagnoses:  Conjunctival laceration, right, initial encounter    Rx / DC Orders ED Discharge Orders         Ordered    ofloxacin (OCUFLOX) 0.3 % ophthalmic solution  4 times daily        07/14/20 1112           Noemi Chapel, MD 07/14/20 1113

## 2020-08-23 DIAGNOSIS — I10 Essential (primary) hypertension: Secondary | ICD-10-CM | POA: Diagnosis not present

## 2020-08-23 DIAGNOSIS — Z125 Encounter for screening for malignant neoplasm of prostate: Secondary | ICD-10-CM | POA: Diagnosis not present

## 2020-08-23 DIAGNOSIS — E782 Mixed hyperlipidemia: Secondary | ICD-10-CM | POA: Diagnosis not present

## 2020-08-23 DIAGNOSIS — R7303 Prediabetes: Secondary | ICD-10-CM | POA: Diagnosis not present

## 2020-08-30 DIAGNOSIS — F329 Major depressive disorder, single episode, unspecified: Secondary | ICD-10-CM | POA: Diagnosis not present

## 2020-08-30 DIAGNOSIS — H1045 Other chronic allergic conjunctivitis: Secondary | ICD-10-CM | POA: Diagnosis not present

## 2020-08-30 DIAGNOSIS — E782 Mixed hyperlipidemia: Secondary | ICD-10-CM | POA: Diagnosis not present

## 2020-08-30 DIAGNOSIS — M1990 Unspecified osteoarthritis, unspecified site: Secondary | ICD-10-CM | POA: Diagnosis not present

## 2020-08-30 DIAGNOSIS — R7303 Prediabetes: Secondary | ICD-10-CM | POA: Diagnosis not present

## 2020-08-30 DIAGNOSIS — Z125 Encounter for screening for malignant neoplasm of prostate: Secondary | ICD-10-CM | POA: Diagnosis not present

## 2020-08-30 DIAGNOSIS — M545 Low back pain, unspecified: Secondary | ICD-10-CM | POA: Diagnosis not present

## 2020-08-30 DIAGNOSIS — I10 Essential (primary) hypertension: Secondary | ICD-10-CM | POA: Diagnosis not present

## 2020-09-05 DIAGNOSIS — Z1283 Encounter for screening for malignant neoplasm of skin: Secondary | ICD-10-CM | POA: Diagnosis not present

## 2020-09-05 DIAGNOSIS — D239 Other benign neoplasm of skin, unspecified: Secondary | ICD-10-CM | POA: Diagnosis not present

## 2020-09-05 DIAGNOSIS — L570 Actinic keratosis: Secondary | ICD-10-CM | POA: Diagnosis not present

## 2020-09-17 DIAGNOSIS — M545 Low back pain, unspecified: Secondary | ICD-10-CM | POA: Diagnosis not present

## 2020-09-17 DIAGNOSIS — M5126 Other intervertebral disc displacement, lumbar region: Secondary | ICD-10-CM | POA: Diagnosis not present

## 2020-09-24 DIAGNOSIS — M545 Low back pain, unspecified: Secondary | ICD-10-CM | POA: Diagnosis not present

## 2021-02-26 DIAGNOSIS — I10 Essential (primary) hypertension: Secondary | ICD-10-CM | POA: Diagnosis not present

## 2021-02-28 DIAGNOSIS — R7303 Prediabetes: Secondary | ICD-10-CM | POA: Diagnosis not present

## 2021-02-28 DIAGNOSIS — Z125 Encounter for screening for malignant neoplasm of prostate: Secondary | ICD-10-CM | POA: Diagnosis not present

## 2021-02-28 DIAGNOSIS — F329 Major depressive disorder, single episode, unspecified: Secondary | ICD-10-CM | POA: Diagnosis not present

## 2021-02-28 DIAGNOSIS — E782 Mixed hyperlipidemia: Secondary | ICD-10-CM | POA: Diagnosis not present

## 2021-02-28 DIAGNOSIS — M545 Low back pain, unspecified: Secondary | ICD-10-CM | POA: Diagnosis not present

## 2021-02-28 DIAGNOSIS — H1045 Other chronic allergic conjunctivitis: Secondary | ICD-10-CM | POA: Diagnosis not present

## 2021-02-28 DIAGNOSIS — M1990 Unspecified osteoarthritis, unspecified site: Secondary | ICD-10-CM | POA: Diagnosis not present

## 2021-02-28 DIAGNOSIS — Z0001 Encounter for general adult medical examination with abnormal findings: Secondary | ICD-10-CM | POA: Diagnosis not present

## 2021-02-28 DIAGNOSIS — I10 Essential (primary) hypertension: Secondary | ICD-10-CM | POA: Diagnosis not present

## 2021-08-23 DIAGNOSIS — E782 Mixed hyperlipidemia: Secondary | ICD-10-CM | POA: Diagnosis not present

## 2021-08-23 DIAGNOSIS — R7303 Prediabetes: Secondary | ICD-10-CM | POA: Diagnosis not present

## 2021-08-23 DIAGNOSIS — I10 Essential (primary) hypertension: Secondary | ICD-10-CM | POA: Diagnosis not present

## 2021-08-29 DIAGNOSIS — M1990 Unspecified osteoarthritis, unspecified site: Secondary | ICD-10-CM | POA: Diagnosis not present

## 2021-08-29 DIAGNOSIS — R7303 Prediabetes: Secondary | ICD-10-CM | POA: Diagnosis not present

## 2021-08-29 DIAGNOSIS — Z0000 Encounter for general adult medical examination without abnormal findings: Secondary | ICD-10-CM | POA: Diagnosis not present

## 2021-08-29 DIAGNOSIS — M545 Low back pain, unspecified: Secondary | ICD-10-CM | POA: Diagnosis not present

## 2021-08-29 DIAGNOSIS — F329 Major depressive disorder, single episode, unspecified: Secondary | ICD-10-CM | POA: Diagnosis not present

## 2021-08-29 DIAGNOSIS — E782 Mixed hyperlipidemia: Secondary | ICD-10-CM | POA: Diagnosis not present

## 2021-08-29 DIAGNOSIS — I10 Essential (primary) hypertension: Secondary | ICD-10-CM | POA: Diagnosis not present

## 2021-08-29 DIAGNOSIS — Z125 Encounter for screening for malignant neoplasm of prostate: Secondary | ICD-10-CM | POA: Diagnosis not present

## 2021-08-29 DIAGNOSIS — H1045 Other chronic allergic conjunctivitis: Secondary | ICD-10-CM | POA: Diagnosis not present

## 2021-09-03 DIAGNOSIS — D485 Neoplasm of uncertain behavior of skin: Secondary | ICD-10-CM | POA: Diagnosis not present

## 2021-09-03 DIAGNOSIS — L570 Actinic keratosis: Secondary | ICD-10-CM | POA: Diagnosis not present

## 2021-09-12 DIAGNOSIS — I10 Essential (primary) hypertension: Secondary | ICD-10-CM | POA: Diagnosis not present

## 2021-09-12 DIAGNOSIS — R7303 Prediabetes: Secondary | ICD-10-CM | POA: Diagnosis not present

## 2021-09-12 DIAGNOSIS — Z125 Encounter for screening for malignant neoplasm of prostate: Secondary | ICD-10-CM | POA: Diagnosis not present

## 2021-09-12 DIAGNOSIS — E782 Mixed hyperlipidemia: Secondary | ICD-10-CM | POA: Diagnosis not present

## 2022-02-25 DIAGNOSIS — R7303 Prediabetes: Secondary | ICD-10-CM | POA: Diagnosis not present

## 2022-02-25 DIAGNOSIS — E782 Mixed hyperlipidemia: Secondary | ICD-10-CM | POA: Diagnosis not present

## 2022-02-25 DIAGNOSIS — I10 Essential (primary) hypertension: Secondary | ICD-10-CM | POA: Diagnosis not present

## 2022-02-25 DIAGNOSIS — Z125 Encounter for screening for malignant neoplasm of prostate: Secondary | ICD-10-CM | POA: Diagnosis not present

## 2022-03-03 DIAGNOSIS — E782 Mixed hyperlipidemia: Secondary | ICD-10-CM | POA: Diagnosis not present

## 2022-03-03 DIAGNOSIS — R7303 Prediabetes: Secondary | ICD-10-CM | POA: Diagnosis not present

## 2022-03-03 DIAGNOSIS — F329 Major depressive disorder, single episode, unspecified: Secondary | ICD-10-CM | POA: Diagnosis not present

## 2022-03-03 DIAGNOSIS — F1290 Cannabis use, unspecified, uncomplicated: Secondary | ICD-10-CM | POA: Diagnosis not present

## 2022-03-03 DIAGNOSIS — I10 Essential (primary) hypertension: Secondary | ICD-10-CM | POA: Diagnosis not present

## 2022-03-03 DIAGNOSIS — Z0000 Encounter for general adult medical examination without abnormal findings: Secondary | ICD-10-CM | POA: Diagnosis not present

## 2022-03-03 DIAGNOSIS — M1990 Unspecified osteoarthritis, unspecified site: Secondary | ICD-10-CM | POA: Diagnosis not present

## 2022-03-03 DIAGNOSIS — Z125 Encounter for screening for malignant neoplasm of prostate: Secondary | ICD-10-CM | POA: Diagnosis not present

## 2022-03-03 DIAGNOSIS — M25512 Pain in left shoulder: Secondary | ICD-10-CM | POA: Diagnosis not present

## 2022-03-11 DIAGNOSIS — H1033 Unspecified acute conjunctivitis, bilateral: Secondary | ICD-10-CM | POA: Diagnosis not present

## 2022-03-11 DIAGNOSIS — Z6821 Body mass index (BMI) 21.0-21.9, adult: Secondary | ICD-10-CM | POA: Diagnosis not present

## 2022-03-11 DIAGNOSIS — H6503 Acute serous otitis media, bilateral: Secondary | ICD-10-CM | POA: Diagnosis not present

## 2022-08-26 DIAGNOSIS — L570 Actinic keratosis: Secondary | ICD-10-CM | POA: Diagnosis not present

## 2022-08-26 DIAGNOSIS — L858 Other specified epidermal thickening: Secondary | ICD-10-CM | POA: Diagnosis not present

## 2022-08-26 DIAGNOSIS — D485 Neoplasm of uncertain behavior of skin: Secondary | ICD-10-CM | POA: Diagnosis not present

## 2022-09-10 DIAGNOSIS — Z125 Encounter for screening for malignant neoplasm of prostate: Secondary | ICD-10-CM | POA: Diagnosis not present

## 2022-09-10 DIAGNOSIS — E782 Mixed hyperlipidemia: Secondary | ICD-10-CM | POA: Diagnosis not present

## 2022-09-10 DIAGNOSIS — R7303 Prediabetes: Secondary | ICD-10-CM | POA: Diagnosis not present

## 2022-09-10 DIAGNOSIS — I10 Essential (primary) hypertension: Secondary | ICD-10-CM | POA: Diagnosis not present

## 2022-09-18 DIAGNOSIS — Z0000 Encounter for general adult medical examination without abnormal findings: Secondary | ICD-10-CM | POA: Diagnosis not present

## 2022-09-18 DIAGNOSIS — I10 Essential (primary) hypertension: Secondary | ICD-10-CM | POA: Diagnosis not present

## 2022-09-18 DIAGNOSIS — F1290 Cannabis use, unspecified, uncomplicated: Secondary | ICD-10-CM | POA: Diagnosis not present

## 2022-09-18 DIAGNOSIS — F329 Major depressive disorder, single episode, unspecified: Secondary | ICD-10-CM | POA: Diagnosis not present

## 2022-09-18 DIAGNOSIS — R7303 Prediabetes: Secondary | ICD-10-CM | POA: Diagnosis not present

## 2022-09-18 DIAGNOSIS — M1990 Unspecified osteoarthritis, unspecified site: Secondary | ICD-10-CM | POA: Diagnosis not present

## 2022-09-18 DIAGNOSIS — E782 Mixed hyperlipidemia: Secondary | ICD-10-CM | POA: Diagnosis not present

## 2022-12-11 DIAGNOSIS — K649 Unspecified hemorrhoids: Secondary | ICD-10-CM | POA: Diagnosis not present

## 2022-12-11 DIAGNOSIS — M79651 Pain in right thigh: Secondary | ICD-10-CM | POA: Diagnosis not present

## 2022-12-22 ENCOUNTER — Encounter: Payer: Self-pay | Admitting: Gastroenterology

## 2022-12-22 ENCOUNTER — Ambulatory Visit: Payer: Medicare HMO | Admitting: Gastroenterology

## 2022-12-22 VITALS — BP 137/86 | HR 56 | Temp 98.2°F | Ht 70.0 in | Wt 148.6 lb

## 2022-12-22 DIAGNOSIS — K649 Unspecified hemorrhoids: Secondary | ICD-10-CM

## 2022-12-22 DIAGNOSIS — Z86010 Personal history of colonic polyps: Secondary | ICD-10-CM | POA: Diagnosis not present

## 2022-12-22 NOTE — Progress Notes (Signed)
GI Office Note    Referring Provider: Leone Payor, FNP Primary Care Physician:  Leone Payor, FNP  Primary Gastroenterologist: Dr. Tasia Catchings  Chief Complaint   Chief Complaint  Patient presents with   Colonoscopy    Colonoscopy screening. Having some problems with hemorrhoids    History of Present Illness   Edward Rhodes is a 67 y.o. male presenting today at the request of Leone Payor, FNP for colonoscopy and hemorrhoids.  Last colonoscopy in July 2016: -2 sessile polyps ranging 2-5 mm in the distal ascending colon and distal transverse colon. -Moderate sigmoid diverticulosis -Large external and internal hemorrhoids -Pathology revealed tubular adenomas -Advised repeat colonoscopy in 5-10 years with overtube.  Has been using preparation H and that is improved. Has an [place] and he drives it lots of places but that seat caused some straining and hemorrhoid issues. He was having some groin pain as well. Thinks it was all related to the [place]. Does a lot of exercising - does chin ups etc. Does not lift weights.   Had bilateral wrist fusions. In the past in [place]. Had prostate cancer at 48 and had surgery. No chemo or radiation. Used to take ibuprofen regularly but now just takes as needed.   Denies any upper or lower GI symptoms.  Denies change in bowel habits, constipation, diarrhea, reflux, dysphagia, nausea, vomiting, early satiety, weight loss, lack of appetite, melena, or BRBPR.   Current Outpatient Medications  Medication Sig Dispense Refill   buPROPion HCl (WELLBUTRIN SR PO) Take by mouth.     ibuprofen (ADVIL,MOTRIN) 200 MG tablet Take 400 mg by mouth every 8 (eight) hours as needed for mild pain.     olmesartan (BENICAR) 20 MG tablet      No current facility-administered medications for this visit.    Past Medical History:  Diagnosis Date   Cancer [facility])    prostate   Cervical radiculopathy    PONV (postoperative nausea and vomiting)     Past Surgical  History:  Procedure Laterality Date   BACK SURGERY     COLONOSCOPY  Dec 2006   [place]: descending colon scattered diverticula, 5mm AVM in transverse colon, no polyps. Routine screening 2016.    COLONOSCOPY N/A 10/13/2014   Procedure: COLONOSCOPY;  Surgeon: [place] Bali, MD;  [facility];  Service: Endoscopy;  Laterality: N/A;  830   HYDROCELE EXCISION     LIPOMA EXCISION Left 09/27/2019   Procedure: EXCISION NECK LIPOMA;  Surgeon: Newman Pies, MD;  Location: [facility];  Service: ENT;  Laterality: Left;   PROSTATECTOMY  2008   WRIST SURGERY      Family History  Problem Relation Age of Onset   Heart disease Father    Dementia Father    Prostate cancer Father    Drug abuse Son    Dementia Mother    Breast cancer Sister    Colon cancer Neg Hx    Colon polyps Neg Hx     Allergies as of 12/22/2022 - Review Complete 12/22/2022  Allergen Reaction Noted   Aleve [naproxen sodium]  04/16/2013    Social History   Socioeconomic History   Marital status: Married    Spouse name: Not on file   Number of children: Not on file   Years of education: Not on file   Highest education level: Not on file  Occupational History   Not on file  Tobacco Use   Smoking status: Never   Smokeless  tobacco: Never  Vaping Use   Vaping status: Not on file  Substance and Sexual Activity   Alcohol use: Yes    Comment: 2-3 glasses wine once per week, h/o heavy drinking 25 years ago   Drug use: Yes    Types: Marijuana    Comment: couple times a week    Sexual activity: Not on file  Other Topics Concern   Not on file  Social History Narrative   Son passed away 16-Aug-2012 from narcotic addiction. Started when was bitten by snake and required narcotic pain meds.    Social Determinants of Health   Financial Resource Strain: Not on file  Food Insecurity: Not on file  Transportation Needs: Not on file  Physical Activity: Not on file  Stress: Not on file  Social Connections:  Not on file  Intimate Partner Violence: Not on file     Review of Systems   Gen: Denies any fever, chills, fatigue, weight loss, lack of appetite.  CV: Denies chest pain, heart palpitations, peripheral edema, syncope.  Resp: Denies shortness of breath at rest or with exertion. Denies wheezing or cough.  GI: see HPI GU : Denies urinary burning, urinary frequency, urinary hesitancy MS: Denies joint pain, muscle weakness, cramps, or limitation of movement.  Derm: Denies rash, itching, dry skin Psych: Denies depression, anxiety, memory loss, and confusion Heme: Denies bruising, bleeding, and enlarged lymph nodes.   Physical Exam   BP 137/86 (BP Location: Right Arm, Patient Position: Sitting, Cuff Size: Normal)   Pulse (!) 56   Temp 98.2 F (36.8 C) (Temporal)   Ht 5\' 10"  (1.778 m)   Wt 148 lb 9.6 oz (67.4 kg)   SpO2 99%   BMI 21.32 kg/m   General:   Alert and oriented. Pleasant and cooperative. Well-nourished and well-developed.  Head:  Normocephalic and atraumatic. Eyes:  Without icterus, sclera clear and conjunctiva pink.  Ears:  Normal auditory acuity. Mouth:  No deformity or lesions, oral mucosa pink.  Lungs:  Clear to auscultation bilaterally. No wheezes, rales, or rhonchi. No distress.  Heart:  S1, S2 present without murmurs appreciated.  Abdomen:  +BS, soft, non-tender and non-distended. No HSM noted. No guarding or rebound. No masses appreciated.  Rectal:  Deferred  Msk:  Symmetrical without gross deformities. Normal posture. Extremities:  Without edema. Neurologic:  Alert and  oriented x4;  grossly normal neurologically. Skin:  Intact without significant lesions or rashes. Psych:  Alert and cooperative. Normal mood and affect.   Assessment   Edward Rhodes is a 67 y.o. male with a history of cervical radiculopathy, prostate cancer, hypercholesteremia, and hypertension presenting today for evaluation prior to surveillance colonoscopy and evaluation of  hemorrhoids.  History of adenomatous colon polyps: Last colonoscopy in 2016 with 2 tubular adenomas removed.  Has had some hemorrhoid troubles as noted below.  He denies any weight loss, lack of appetite, early satiety, constipation, diarrhea, change in bowel habits, melena, or BRBPR.  Will proceed with scheduling surveillance colonoscopy.  Hemorrhoids: Suspected be secondary to riding e-bike frequently.  Symptoms have resolved since using Preparation H.  Denied any itching, pain, or bleeding.  PLAN   Proceed with colonoscopy with propofol by Dr. Tasia Catchings in near future: the risks, benefits, and alternatives have been discussed with the patient in detail. The patient states understanding and desires to proceed. ASA 2 Preparation H as needed Follow up as needed.    Brooke Bonito, MSN, FNP-BC, AGACNP-BC [facility]

## 2022-12-22 NOTE — Patient Instructions (Signed)
Continues improve H&H as needed if you have any hemorrhoid flares.  We will get you scheduled for colonoscopy in the near future with Dr. Tasia Catchings.  It was a pleasure to see you today. I want to create trusting relationships with patients. If you receive a survey regarding your visit,  I greatly appreciate you taking time to fill this out on paper or through your MyChart. I value your feedback.  Brooke Bonito, MSN, FNP-BC, AGACNP-BC [facility]

## 2022-12-31 ENCOUNTER — Telehealth: Payer: Self-pay | Admitting: *Deleted

## 2022-12-31 MED ORDER — PEG 3350-KCL-NA BICARB-NACL 420 G PO SOLR
4000.0000 mL | Freq: Once | ORAL | 0 refills | Status: AC
Start: 2022-12-31 — End: 2022-12-31

## 2022-12-31 NOTE — Telephone Encounter (Signed)
Spoke with pt. He scheduled his TCS with Dr. Jena Gauss, ASA 2 on 10/7.advised will send instructions. Rx for prep sent to pharmacy  PA approved via cohere. Authorization #[id number], DOS:  01/19/2023 - 03/21/2023

## 2023-01-19 ENCOUNTER — Ambulatory Visit (HOSPITAL_COMMUNITY): Admission: RE | Admit: 2023-01-19 | Payer: Medicare HMO | Source: Home / Self Care | Admitting: Internal Medicine

## 2023-01-19 ENCOUNTER — Encounter (HOSPITAL_COMMUNITY): Admission: RE | Payer: Self-pay | Source: Home / Self Care

## 2023-01-19 SURGERY — COLONOSCOPY WITH PROPOFOL
Anesthesia: Monitor Anesthesia Care

## 2023-03-20 DIAGNOSIS — E782 Mixed hyperlipidemia: Secondary | ICD-10-CM | POA: Diagnosis not present

## 2023-03-20 DIAGNOSIS — Z125 Encounter for screening for malignant neoplasm of prostate: Secondary | ICD-10-CM | POA: Diagnosis not present

## 2023-03-20 DIAGNOSIS — R7303 Prediabetes: Secondary | ICD-10-CM | POA: Diagnosis not present

## 2023-03-27 DIAGNOSIS — I10 Essential (primary) hypertension: Secondary | ICD-10-CM | POA: Diagnosis not present

## 2023-03-27 DIAGNOSIS — R7303 Prediabetes: Secondary | ICD-10-CM | POA: Diagnosis not present

## 2023-03-27 DIAGNOSIS — Z0000 Encounter for general adult medical examination without abnormal findings: Secondary | ICD-10-CM | POA: Diagnosis not present

## 2023-03-27 DIAGNOSIS — M1990 Unspecified osteoarthritis, unspecified site: Secondary | ICD-10-CM | POA: Diagnosis not present

## 2023-03-27 DIAGNOSIS — E782 Mixed hyperlipidemia: Secondary | ICD-10-CM | POA: Diagnosis not present

## 2023-03-27 DIAGNOSIS — E875 Hyperkalemia: Secondary | ICD-10-CM | POA: Diagnosis not present

## 2023-03-27 DIAGNOSIS — M79604 Pain in right leg: Secondary | ICD-10-CM | POA: Diagnosis not present

## 2023-03-27 DIAGNOSIS — Z0001 Encounter for general adult medical examination with abnormal findings: Secondary | ICD-10-CM | POA: Diagnosis not present

## 2023-03-27 DIAGNOSIS — F329 Major depressive disorder, single episode, unspecified: Secondary | ICD-10-CM | POA: Diagnosis not present

## 2023-04-02 ENCOUNTER — Encounter (INDEPENDENT_AMBULATORY_CARE_PROVIDER_SITE_OTHER): Payer: Self-pay | Admitting: *Deleted

## 2023-06-23 ENCOUNTER — Telehealth: Payer: Self-pay | Admitting: Internal Medicine

## 2023-06-23 NOTE — Telephone Encounter (Signed)
 Pt will need to have an office visit. He was last seen in 9/24.  Thank you

## 2023-06-23 NOTE — Telephone Encounter (Signed)
 Patient's wife actually said he was supposed to have been rescheduled for his colonoscopy but no one has called to reschedule

## 2023-07-06 NOTE — Telephone Encounter (Signed)
 Patient has been scheduled

## 2023-07-16 ENCOUNTER — Ambulatory Visit (INDEPENDENT_AMBULATORY_CARE_PROVIDER_SITE_OTHER): Admitting: Gastroenterology

## 2023-07-16 ENCOUNTER — Encounter: Payer: Self-pay | Admitting: *Deleted

## 2023-07-16 ENCOUNTER — Encounter: Payer: Self-pay | Admitting: Gastroenterology

## 2023-07-16 VITALS — BP 138/78 | HR 68 | Temp 97.7°F | Ht 70.0 in | Wt 151.0 lb

## 2023-07-16 DIAGNOSIS — K649 Unspecified hemorrhoids: Secondary | ICD-10-CM | POA: Diagnosis not present

## 2023-07-16 DIAGNOSIS — Z860101 Personal history of adenomatous and serrated colon polyps: Secondary | ICD-10-CM | POA: Diagnosis not present

## 2023-07-16 NOTE — Progress Notes (Signed)
 GI Office Note    Referring Provider: Leone Payor, FNP Primary Care Physician:  Leone Payor, FNP Primary Gastroenterologist: Vista Lawman, MD  Date:  07/16/2023  ID:  Edward Rhodes, DOB 09/09/1955, MRN [id number]  Chief Complaint   Chief Complaint  Patient presents with   Colonoscopy    Colonoscopy screening    History of Present Illness  Edward Rhodes is a 68 y.o. male with a history of HTN, HLD, depression, prostate cancer, cervical radiculopathy, presenting todayto discuss rescheduling your colonoscopy.   Last colonoscopy in July 2016: -2 sessile polyps ranging 2-5 mm in the distal ascending colon and distal transverse colon. -Moderate sigmoid diverticulosis -Large external and internal hemorrhoids -Pathology revealed tubular adenomas -Advised repeat colonoscopy in 5-10 years with overtube.  Last office visit 12/22/22. Using preparation H or hemorrhoids and rides his bike lots of places and believes that straining caused his hemorrhoid issues. Also noted groin pain. Denied any GI symptoms. Scheduled for colonoscopy.  Today:  Doing well overall. Has coffee in the morning and then goes to the bathroom. Was a gymnast in high school.   Recently has not had much issue ith his hemorrhoids. Has a bidet that he usses for hygiene. Does not have apin with flares but will have bleeding.   He states he got sick (vomiting) from the prep and so he cancelled his procedure.   Had bilateral wrist fusions after hand glider accidents. Has been splitting wood all morning and has pains in his thumbs.   Wt Readings from Last 3 Encounters:  07/16/23 151 lb (68.5 kg)  12/22/22 148 lb 9.6 oz (67.4 kg)  07/14/20 155 lb (70.3 kg)    Current Outpatient Medications  Medication Sig Dispense Refill   ibuprofen (ADVIL,MOTRIN) 200 MG tablet Take 400 mg by mouth every 8 (eight) hours as needed for mild pain.     Multiple Vitamins-Minerals (ONE-A-DAY FOR HIM VITACRAVES PO) Take 1  tablet by mouth daily.     olmesartan (BENICAR) 20 MG tablet      No current facility-administered medications for this visit.    Past Medical History:  Diagnosis Date   Cancer [facility])    prostate   Cervical radiculopathy    Hypertension    PONV (postoperative nausea and vomiting)     Past Surgical History:  Procedure Laterality Date   BACK SURGERY     COLONOSCOPY  Dec 2006   [place]: descending colon scattered diverticula, 5mm AVM in transverse colon, no polyps. Routine screening 2016.    COLONOSCOPY N/A 10/13/2014   Procedure: COLONOSCOPY;  Surgeon: West Bali, MD;  Location: [facility];  Service: Endoscopy;  Laterality: N/A;  830   HYDROCELE EXCISION     LIPOMA EXCISION Left 09/27/2019   Procedure: EXCISION NECK LIPOMA;  Surgeon: Newman Pies, MD;  Location: [facility];  Service: ENT;  Laterality: Left;   PROSTATECTOMY  2008   WRIST SURGERY      Family History  Problem Relation Age of Onset   Heart disease Father    Dementia Father    Prostate cancer Father    Drug abuse Son    Dementia Mother    Breast cancer Sister    Colon cancer Neg Hx    Colon polyps Neg Hx     Allergies as of 07/16/2023 - Review Complete 07/16/2023  Allergen Reaction Noted   Aleve [naproxen sodium]  04/16/2013    Social History   Socioeconomic History  Marital status: Married    Spouse name: Not on file   Number of children: Not on file   Years of education: Not on file   Highest education level: Not on file  Occupational History   Not on file  Tobacco Use   Smoking status: Never   Smokeless tobacco: Never  Vaping Use   Vaping status: Not on file  Substance and Sexual Activity   Alcohol use: Yes    Comment: 2-3 glasses wine once per week, h/o heavy drinking 25 years ago   Drug use: Yes    Types: Marijuana    Comment: couple times a week    Sexual activity: Not on file  Other Topics Concern   Not on file  Social History Narrative   Son passed away 24-Aug-2012  from narcotic addiction. Started when was bitten by snake and required narcotic pain meds.    Social Drivers of Corporate investment banker Strain: Not on file  Food Insecurity: Not on file  Transportation Needs: Not on file  Physical Activity: Not on file  Stress: Not on file  Social Connections: Not on file     Review of Systems   Gen: Denies fever, chills, anorexia. Denies fatigue, weakness, weight loss.  CV: Denies chest pain, palpitations, syncope, peripheral edema, and claudication. Resp: Denies dyspnea at rest, cough, wheezing, coughing up blood, and pleurisy. GI: See HPI Derm: Denies rash, itching, dry skin Psych: Denies depression, anxiety, memory loss, confusion. No homicidal or suicidal ideation.  Heme: Denies bruising, bleeding, and enlarged lymph nodes.  Physical Exam   BP 138/78 (BP Location: Right Arm, Patient Position: Sitting, Cuff Size: Large)   Pulse 68   Temp 97.7 F (36.5 C) (Temporal)   Ht 5\' 10"  (1.778 m)   Wt 151 lb (68.5 kg)   BMI 21.67 kg/m   General:   Alert and oriented. No distress noted. Pleasant and cooperative.  Head:  Normocephalic and atraumatic. Eyes:  Conjuctiva clear without scleral icterus. Mouth:  Oral mucosa pink and moist. Good dentition. No lesions. Lungs:  Clear to auscultation bilaterally. No wheezes, rales, or rhonchi. No distress.  Heart:  S1, S2 present without murmurs appreciated.  Abdomen:  +BS, soft, non-tender and non-distended. No rebound or guarding. No HSM or masses noted. Rectal: deferred Msk:  Symmetrical without gross deformities. Normal posture. Extremities:  Without edema. Neurologic:  Alert and  oriented x4 Psych:  Alert and cooperative. Normal mood and affect.  Assessment  Edward Rhodes is a 68 y.o. male with a history of cervical radiculopathy, prostate cancer, HLD, and HTN presenting today to discuss rescheduling colonoscopy.   Hemorrhoids: Likely secondary to activities including prior employment and  bike riding. Currently without issue. Advised to continue hydrocortisone rectal cream as needed.   Screening colon cancer: Colonoscopy in 08/25/2014 with 2 tubular adenomas, recommended colonoscopy in 5-10 years. Most recent attempt at colonoscopy unsuccessful - vomited prep. No alarm symptoms present at this time. Will reschedule colonoscopy now and will change prep and give zofran in case nausea/vomiting ensures.   PLAN   Proceed with colonoscopy with propofol by Dr. Tasia Catchings in near future: the risks, benefits, and alternatives have been discussed with the patient in detail. The patient states understanding and desires to proceed. ASA 2 Suflave Zofran every 8 hours as needed for nausea/vomiting with prep Hydrocortisone cream needed for hemorrhoids Follow up as needed  Brooke Bonito, MSN, FNP-BC, AGACNP-BC [facility]

## 2023-07-16 NOTE — Patient Instructions (Addendum)
 We are getting you scheduled for your colonoscopy in the near future with Dr. Tasia Catchings.  I am sending in Zofran for you to take once every 8 hours as needed for nausea or vomiting with your prep.  We will try different prep than what you used previously.  If you have a flare of hemorrhoids, you may use rectal hydrocortisone twice a day for a week and then as needed.  It was a pleasure to see you today. I want to create trusting relationships with patients. If you receive a survey regarding your visit,  I greatly appreciate you taking time to fill this out on paper or through your MyChart. I value your feedback.  Brooke Bonito, MSN, FNP-BC, AGACNP-BC [facility]

## 2023-07-22 ENCOUNTER — Telehealth: Payer: Self-pay | Admitting: *Deleted

## 2023-07-22 NOTE — Telephone Encounter (Signed)
 Pt needs to cancel on 08/10/23 due to death in the family. He will be rescheduled once we get providers May schedule.

## 2023-07-22 NOTE — Telephone Encounter (Signed)
 NOTED

## 2023-07-22 NOTE — Telephone Encounter (Signed)
 Thank you , please keep a reminder in file to be scheduled

## 2023-07-27 DIAGNOSIS — E785 Hyperlipidemia, unspecified: Secondary | ICD-10-CM | POA: Diagnosis not present

## 2023-07-27 DIAGNOSIS — Z008 Encounter for other general examination: Secondary | ICD-10-CM | POA: Diagnosis not present

## 2023-07-27 DIAGNOSIS — M1990 Unspecified osteoarthritis, unspecified site: Secondary | ICD-10-CM | POA: Diagnosis not present

## 2023-07-27 DIAGNOSIS — R7303 Prediabetes: Secondary | ICD-10-CM | POA: Diagnosis not present

## 2023-07-27 DIAGNOSIS — I10 Essential (primary) hypertension: Secondary | ICD-10-CM | POA: Diagnosis not present

## 2023-07-29 ENCOUNTER — Encounter: Payer: Self-pay | Admitting: *Deleted

## 2023-07-29 NOTE — Telephone Encounter (Signed)
 Pt has been rescheduled for 08/26/23. Updated instructions mailed

## 2023-08-10 ENCOUNTER — Ambulatory Visit (HOSPITAL_COMMUNITY): Admit: 2023-08-10 | Admitting: Gastroenterology

## 2023-08-10 ENCOUNTER — Encounter (HOSPITAL_COMMUNITY): Payer: Self-pay

## 2023-08-10 SURGERY — COLONOSCOPY
Anesthesia: Choice

## 2023-08-25 ENCOUNTER — Telehealth (INDEPENDENT_AMBULATORY_CARE_PROVIDER_SITE_OTHER): Payer: Self-pay | Admitting: Gastroenterology

## 2023-08-25 NOTE — Telephone Encounter (Signed)
 Pt contacted and rescheduled TCS from 08/26/23 to 09/08/23 due to water  issue. New instructions will be mailed to pt.

## 2023-08-25 NOTE — OR Nursing (Signed)
 Patient notified that Endo procedure is cancelled due to no water  in [place]

## 2023-09-08 ENCOUNTER — Encounter (HOSPITAL_COMMUNITY): Payer: Self-pay | Admitting: Anesthesiology

## 2023-09-08 ENCOUNTER — Telehealth (INDEPENDENT_AMBULATORY_CARE_PROVIDER_SITE_OTHER): Payer: Self-pay | Admitting: Gastroenterology

## 2023-09-08 ENCOUNTER — Ambulatory Visit (HOSPITAL_COMMUNITY): Admission: RE | Admit: 2023-09-08 | Source: Home / Self Care | Admitting: Gastroenterology

## 2023-09-08 ENCOUNTER — Encounter (HOSPITAL_COMMUNITY): Admission: RE | Payer: Self-pay | Source: Home / Self Care

## 2023-09-08 SURGERY — COLONOSCOPY
Anesthesia: Choice

## 2023-09-08 NOTE — Telephone Encounter (Signed)
 Noted

## 2023-09-08 NOTE — Telephone Encounter (Signed)
 Spoke to pt and informed of providers recommendations. He states does not wish to reschedule at this time. He says when he gets ready to reschedule he will give us  a call

## 2023-09-08 NOTE — Telephone Encounter (Signed)
 Yes Pill prep and zofran  standing

## 2023-09-08 NOTE — Telephone Encounter (Signed)
 Thanks for the update , I am not sure if he was taking zofran    Hi Amalia Badder,  Can you please schedule a follow up appointment for this patient in 2-3 months with Jearlean Mince ?  Thanks,  Maxxon Schwanke Faizan Kagan Mutchler , MD Gastroenterology and Hepatology [facility]

## 2023-09-08 NOTE — Telephone Encounter (Signed)
 Pt left voicemail that he is cancelling TCS for today at noon due to unable to drink the prep. Pt left voicemail at 11:30am.  Pt states that if the provider needs to take the money out of his account he can. This is the 3rd time pt has cancelled, per office policy he will have to have TCS done elsewhere.

## 2023-09-08 NOTE — Telephone Encounter (Signed)
 Pt is cancelling his procedure today due to vomiting up the prep. He said this is his third attempt and that he is sorry.

## 2023-09-09 DIAGNOSIS — L570 Actinic keratosis: Secondary | ICD-10-CM | POA: Diagnosis not present

## 2023-09-09 DIAGNOSIS — L821 Other seborrheic keratosis: Secondary | ICD-10-CM | POA: Diagnosis not present

## 2023-09-09 DIAGNOSIS — D1801 Hemangioma of skin and subcutaneous tissue: Secondary | ICD-10-CM | POA: Diagnosis not present

## 2023-09-18 DIAGNOSIS — R7303 Prediabetes: Secondary | ICD-10-CM | POA: Diagnosis not present

## 2023-09-18 DIAGNOSIS — I10 Essential (primary) hypertension: Secondary | ICD-10-CM | POA: Diagnosis not present

## 2023-09-18 DIAGNOSIS — Z125 Encounter for screening for malignant neoplasm of prostate: Secondary | ICD-10-CM | POA: Diagnosis not present

## 2023-09-18 DIAGNOSIS — E782 Mixed hyperlipidemia: Secondary | ICD-10-CM | POA: Diagnosis not present

## 2023-09-25 DIAGNOSIS — M25531 Pain in right wrist: Secondary | ICD-10-CM | POA: Diagnosis not present

## 2023-09-25 DIAGNOSIS — E875 Hyperkalemia: Secondary | ICD-10-CM | POA: Diagnosis not present

## 2023-09-25 DIAGNOSIS — F1290 Cannabis use, unspecified, uncomplicated: Secondary | ICD-10-CM | POA: Diagnosis not present

## 2023-09-25 DIAGNOSIS — Z125 Encounter for screening for malignant neoplasm of prostate: Secondary | ICD-10-CM | POA: Diagnosis not present

## 2023-09-25 DIAGNOSIS — M25551 Pain in right hip: Secondary | ICD-10-CM | POA: Diagnosis not present

## 2023-09-25 DIAGNOSIS — H9193 Unspecified hearing loss, bilateral: Secondary | ICD-10-CM | POA: Diagnosis not present

## 2023-09-25 DIAGNOSIS — E782 Mixed hyperlipidemia: Secondary | ICD-10-CM | POA: Diagnosis not present

## 2023-09-25 DIAGNOSIS — R7303 Prediabetes: Secondary | ICD-10-CM | POA: Diagnosis not present

## 2023-09-25 DIAGNOSIS — M25532 Pain in left wrist: Secondary | ICD-10-CM | POA: Diagnosis not present

## 2023-09-25 DIAGNOSIS — M1990 Unspecified osteoarthritis, unspecified site: Secondary | ICD-10-CM | POA: Diagnosis not present

## 2023-09-25 DIAGNOSIS — F329 Major depressive disorder, single episode, unspecified: Secondary | ICD-10-CM | POA: Diagnosis not present

## 2023-09-25 DIAGNOSIS — I10 Essential (primary) hypertension: Secondary | ICD-10-CM | POA: Diagnosis not present

## 2023-12-10 ENCOUNTER — Encounter (INDEPENDENT_AMBULATORY_CARE_PROVIDER_SITE_OTHER): Payer: Self-pay | Admitting: Gastroenterology

## 2024-01-01 DIAGNOSIS — I10 Essential (primary) hypertension: Secondary | ICD-10-CM | POA: Diagnosis not present

## 2024-01-01 DIAGNOSIS — Z008 Encounter for other general examination: Secondary | ICD-10-CM | POA: Diagnosis not present

## 2024-01-06 DIAGNOSIS — E782 Mixed hyperlipidemia: Secondary | ICD-10-CM | POA: Diagnosis not present

## 2024-01-06 DIAGNOSIS — F418 Other specified anxiety disorders: Secondary | ICD-10-CM | POA: Diagnosis not present

## 2024-01-11 DIAGNOSIS — M25551 Pain in right hip: Secondary | ICD-10-CM | POA: Diagnosis not present

## 2024-01-11 DIAGNOSIS — E782 Mixed hyperlipidemia: Secondary | ICD-10-CM | POA: Diagnosis not present

## 2024-01-11 DIAGNOSIS — M25531 Pain in right wrist: Secondary | ICD-10-CM | POA: Diagnosis not present

## 2024-01-11 DIAGNOSIS — F1290 Cannabis use, unspecified, uncomplicated: Secondary | ICD-10-CM | POA: Diagnosis not present

## 2024-01-11 DIAGNOSIS — F329 Major depressive disorder, single episode, unspecified: Secondary | ICD-10-CM | POA: Diagnosis not present

## 2024-01-11 DIAGNOSIS — Z23 Encounter for immunization: Secondary | ICD-10-CM | POA: Diagnosis not present

## 2024-01-11 DIAGNOSIS — E875 Hyperkalemia: Secondary | ICD-10-CM | POA: Diagnosis not present

## 2024-01-11 DIAGNOSIS — H9193 Unspecified hearing loss, bilateral: Secondary | ICD-10-CM | POA: Diagnosis not present

## 2024-01-11 DIAGNOSIS — R7303 Prediabetes: Secondary | ICD-10-CM | POA: Diagnosis not present

## 2024-01-11 DIAGNOSIS — Z125 Encounter for screening for malignant neoplasm of prostate: Secondary | ICD-10-CM | POA: Diagnosis not present

## 2024-01-11 DIAGNOSIS — M1990 Unspecified osteoarthritis, unspecified site: Secondary | ICD-10-CM | POA: Diagnosis not present

## 2024-01-11 DIAGNOSIS — I10 Essential (primary) hypertension: Secondary | ICD-10-CM | POA: Diagnosis not present

## 2024-01-19 DIAGNOSIS — M180 Bilateral primary osteoarthritis of first carpometacarpal joints: Secondary | ICD-10-CM | POA: Diagnosis not present

## 2024-01-19 DIAGNOSIS — M79645 Pain in left finger(s): Secondary | ICD-10-CM | POA: Diagnosis not present

## 2024-01-19 DIAGNOSIS — M79644 Pain in right finger(s): Secondary | ICD-10-CM | POA: Diagnosis not present

## 2024-01-19 DIAGNOSIS — M1811 Unilateral primary osteoarthritis of first carpometacarpal joint, right hand: Secondary | ICD-10-CM | POA: Diagnosis not present

## 2024-01-26 DIAGNOSIS — Z1211 Encounter for screening for malignant neoplasm of colon: Secondary | ICD-10-CM | POA: Diagnosis not present

## 2024-02-03 LAB — COLOGUARD: COLOGUARD: NEGATIVE

## 2024-04-22 ENCOUNTER — Encounter: Payer: Self-pay | Admitting: *Deleted

## 2024-04-22 NOTE — Progress Notes (Signed)
 Edward Rhodes                                          MRN: [id number]   04/22/2024   The [facility] Quality Team Specialist reviewed this patient medical record for the purposes of chart review for care gap closure. The following were reviewed: chart review for care gap closure-controlling blood pressure.    [facility] Quality Team

## 2024-05-06 ENCOUNTER — Encounter (HOSPITAL_COMMUNITY): Payer: Self-pay | Admitting: Emergency Medicine

## 2024-05-06 ENCOUNTER — Emergency Department (HOSPITAL_COMMUNITY)
Admission: EM | Admit: 2024-05-06 | Discharge: 2024-05-06 | Disposition: A | Discharge status: Home / Self Care | Attending: Emergency Medicine | Admitting: Emergency Medicine

## 2024-05-06 ENCOUNTER — Emergency Department (HOSPITAL_COMMUNITY)

## 2024-05-06 ENCOUNTER — Other Ambulatory Visit: Payer: Self-pay

## 2024-05-06 DIAGNOSIS — M5410 Radiculopathy, site unspecified: Secondary | ICD-10-CM | POA: Insufficient documentation

## 2024-05-06 DIAGNOSIS — R200 Anesthesia of skin: Secondary | ICD-10-CM | POA: Insufficient documentation

## 2024-05-06 DIAGNOSIS — I10 Essential (primary) hypertension: Secondary | ICD-10-CM | POA: Insufficient documentation

## 2024-05-06 LAB — CBC WITH DIFFERENTIAL/PLATELET
Abs Immature Granulocytes: 0.01 K/uL (ref 0.00–0.07)
Basophils Absolute: 0.1 K/uL (ref 0.0–0.1)
Basophils Relative: 1 %
Eosinophils Absolute: 0.1 K/uL (ref 0.0–0.5)
Eosinophils Relative: 2 %
HCT: 44.2 % (ref 39.0–52.0)
Hemoglobin: 14.5 g/dL (ref 13.0–17.0)
Immature Granulocytes: 0 %
Lymphocytes Relative: 21 %
Lymphs Abs: 1.1 K/uL (ref 0.7–4.0)
MCH: 31.3 pg (ref 26.0–34.0)
MCHC: 32.8 g/dL (ref 30.0–36.0)
MCV: 95.3 fL (ref 80.0–100.0)
Monocytes Absolute: 0.6 K/uL (ref 0.1–1.0)
Monocytes Relative: 11 %
Neutro Abs: 3.5 K/uL (ref 1.7–7.7)
Neutrophils Relative %: 65 %
Platelets: 186 K/uL (ref 150–400)
RBC: 4.64 MIL/uL (ref 4.22–5.81)
RDW: 12.5 % (ref 11.5–15.5)
WBC: 5.4 K/uL (ref 4.0–10.5)
nRBC: 0 % (ref 0.0–0.2)

## 2024-05-06 LAB — COMPREHENSIVE METABOLIC PANEL WITH GFR
ALT: 6 U/L (ref 0–44)
AST: 17 U/L (ref 15–41)
Albumin: 4.6 g/dL (ref 3.5–5.0)
Alkaline Phosphatase: 62 U/L (ref 38–126)
Anion gap: 12 (ref 5–15)
BUN: 19 mg/dL (ref 8–23)
CO2: 24 mmol/L (ref 22–32)
Calcium: 9.6 mg/dL (ref 8.9–10.3)
Chloride: 102 mmol/L (ref 98–111)
Creatinine, Ser: 0.83 mg/dL (ref 0.61–1.24)
GFR, Estimated: >60 mL/min
Glucose, Bld: 111 mg/dL — ABNORMAL HIGH (ref 70–99)
Potassium: 4.6 mmol/L (ref 3.5–5.1)
Sodium: 139 mmol/L (ref 135–145)
Total Bilirubin: 0.5 mg/dL (ref 0.0–1.2)
Total Protein: 7 g/dL (ref 6.5–8.1)

## 2024-05-06 LAB — TROPONIN T, HIGH SENSITIVITY
Troponin T High Sensitivity: <6 ng/L (ref 0–19)
Troponin T High Sensitivity: 6 ng/L (ref 0–19)

## 2024-05-06 NOTE — ED Triage Notes (Addendum)
 Pt in w/ complaints of hypertension x a few days and bilateral arm numbness that comes and goes x few days. Pt no longer takes bp meds but had some at home. States he took 1 olmesartan this morning.

## 2024-05-06 NOTE — Discharge Instructions (Signed)
 Follow-up with [facility].  Follow-up with Dr. Colon or one of his colleagues

## 2024-05-06 NOTE — ED Notes (Signed)
 Patient transported to MRI

## 2024-05-07 NOTE — ED Provider Notes (Signed)
 " [place] EMERGENCY DEPARTMENT AT [facility] Provider Note   CSN: [id number] Arrival date & time: 05/06/24  9185     Patient presents with: Hypertension   Edward Rhodes is a 69 y.o. male.   Patient is concerned his blood pressure is mildly elevated and he states he has been having these episodes where he has numbness in both his arms especially after he does chin ups.  This has happened like 4 times and the numbness stays around for a number of minutes now.  He had stopped taking his blood pressure medicine but started back again today  The history is provided by the patient and medical records. No language interpreter was used.  Hypertension This is a recurrent problem. The problem occurs constantly. Pertinent negatives include no chest pain, no abdominal pain and no headaches. Nothing aggravates the symptoms. Nothing relieves the symptoms. He has tried nothing for the symptoms.       Prior to Admission medications  Medication Sig Start Date End Date Taking? Authorizing Provider  ibuprofen  (ADVIL ,MOTRIN ) 200 MG tablet Take 400 mg by mouth every 8 (eight) hours as needed for mild pain.    [provider]  Multiple Vitamins-Minerals (ONE-A-DAY FOR HIM VITACRAVES PO) Take 1 tablet by mouth daily.    [provider]  olmesartan (BENICAR) 20 MG tablet  09/18/22   [provider]    Allergies: Aleve [naproxen sodium]    Review of Systems  Constitutional:  Negative for appetite change and fatigue.  HENT:  Negative for congestion, ear discharge and sinus pressure.   Eyes:  Negative for discharge.  Respiratory:  Negative for cough.   Cardiovascular:  Negative for chest pain.  Gastrointestinal:  Negative for abdominal pain and diarrhea.  Genitourinary:  Negative for frequency and hematuria.  Musculoskeletal:  Negative for back pain.  Skin:  Negative for rash.  Neurological:  Negative for seizures and headaches.       Numbness in arms   Psychiatric/Behavioral:  Negative for hallucinations.     Updated Vital Signs BP (!) 159/89   Pulse (!) 50   Temp 97.9 F (36.6 C) (Oral)   Resp 11   Ht 5' 10 (1.778 m)   Wt 68 kg   SpO2 98%   BMI 21.52 kg/m   Physical Exam Vitals and nursing note reviewed.  Constitutional:      Appearance: He is well-developed.  HENT:     Head: Normocephalic.     Nose: Nose normal.  Eyes:     General: No scleral icterus.    Conjunctiva/sclera: Conjunctivae normal.  Neck:     Thyroid : No thyromegaly.  Cardiovascular:     Rate and Rhythm: Normal rate and regular rhythm.     Heart sounds: No murmur heard.    No friction rub. No gallop.  Pulmonary:     Breath sounds: No stridor. No wheezing or rales.  Chest:     Chest wall: No tenderness.  Abdominal:     General: There is no distension.     Tenderness: There is no abdominal tenderness. There is no rebound.  Musculoskeletal:        General: Normal range of motion.     Cervical back: Neck supple.  Lymphadenopathy:     Cervical: No cervical adenopathy.  Skin:    Findings: No erythema or rash.  Neurological:     Mental Status: He is alert and oriented to person, place, and time.     Motor:  No abnormal muscle tone.     Coordination: Coordination normal.  Psychiatric:        Behavior: Behavior normal.     (all labs ordered are listed, but only abnormal results are displayed) Labs Reviewed  COMPREHENSIVE METABOLIC PANEL WITH GFR - Abnormal; Notable for the following components:      Result Value   Glucose, Bld 111 (*)    All other components within normal limits  CBC WITH DIFFERENTIAL/PLATELET  TROPONIN T, HIGH SENSITIVITY  TROPONIN T, HIGH SENSITIVITY    EKG: None  Radiology: MR Cervical Spine Wo Contrast Result Date: 05/06/2024 EXAM: MRI CERVICAL SPINE WITHOUT CONTRAST 05/06/2024 10:41:14 AM TECHNIQUE: Multiplanar multisequence MRI of the cervical spine was performed. COMPARISON: Cervical spine MRI 09/03/2007. CLINICAL  HISTORY: 69 year old male with ataxia, nontraumatic, cervical pathology suspected. Intermittent bilateral upper extremity numbness. Hypertension. FINDINGS: BONES AND ALIGNMENT: Mildly exaggerated upper cervical lordosis. Mild degenerative appearing retrolisthesis at C3-C4 and C4-C5, appears stable since 2019. Homogeneous degenerative appearing vertebral endplate Rhodes edema at C3, C6-C7. No other acute osseous abnormality. Background Rhodes signal is normal. Normal vertebral body heights. SPINAL CORD: Normal spinal cord size. No abnormal spinal cord signal. SOFT TISSUES: No paraspinal mass. Overlying the C7 spinous process is a small but increased 8 to 9 mm focus of fluid or mild soft tissue inflammation which is new. Negative cervicomedullary junction and visible posterior fossa. Preserved major vascular flow voids in the bilateral neck. Left vertebral artery appears mildly dominant as before. Negative visible lung apices. DEGENERATIVE: C2-C3 negative. C3-C4 retrolisthesis and disc space loss. Circumferential disc osteophyte complex. Mild facet hypertrophy. Mild spinal stenosis. No spinal cord mass effect. Moderate to severe left and moderate right C4 neural foraminal stenosis. This level is stable. C4-C5 similar degenerative retrolisthesis and disc space loss. Circumferential disc osteophyte complex with a midline slightly caudal component of disc protrusion which appears small but progressed (series 5 image 8). Moderate facet and ligamentum flavum hypertrophy. Moderate spinal stenosis with mild spinal cord mass effect. No cord signal abnormality. Moderate to severe left and moderate right C5 neural foraminal stenosis. This level has progressed. C5-C6 circumferential disc osteophyte complex asymmetric to the right. Mild posterior element hypertrophy. No spinal stenosis. Moderate to severe right C6 neural foraminal stenosis. This level is stable. C6-C7 progressive disc space loss. Circumferential disc osteophyte  complex with a broad based posterior component. Mild to moderate ligamentum flavum hypertrophy. No spinal stenosis. Moderate left greater than right C7 neural foraminal stenosis. This level has mildly progressed. C7-T1 negative. No visible upper thoracic spinal stenosis. IMPRESSION: 1. Chronic cervical spine degeneration in the setting of multilevel mild spondylolisthesis. 2. Progression at C4-C5 since 2019 MRI with Moderate spinal stenosis, moderate to severe neural foraminal stenosis. Mild spinal cord mass effect, no cord signal abnormality. 3. Other levels more stable. Mild progression at C6-C7 without spinal stenosis. Mild spinal stenosis at C3-C4. 4. Mild cervical interspinous bursitis at the tip of the C7 spinous process. Electronically signed by: Helayne Hurst MD 05/06/2024 10:54 AM EST RP Workstation: [id number]   DG Chest 2 View Result Date: 05/06/2024 CLINICAL DATA:  Hypertension, bilateral arm numbness EXAM: CHEST - 2 VIEW COMPARISON:  None Available. FINDINGS: The heart size and mediastinal contours are within normal limits. Both lungs are clear. Old right clavicular fracture is noted. IMPRESSION: No active cardiopulmonary disease. Electronically Signed   By: Lynwood Landy Raddle M.D.   On: 05/06/2024 09:40     Procedures   Medications Ordered in the ED - No data  to display                                  Medical Decision Making Amount and/or Complexity of Data Reviewed Labs: ordered. Radiology: ordered. ECG/medicine tests: ordered.   Patient with hypertension.  He will continue taking his blood pressure medicine and follow-up with his PCP.  Patient with radicular symptoms.  MRI shows moderate spinal stenosis and moderate to severe neuroforaminal stenosis mild spinal cord mass effect no cord signal abnormality.  Patient is referred to neurosurgery for outpatient     Final diagnoses:  Primary hypertension  Radiculopathy, unspecified spinal region    ED Discharge Orders     None           Suzette Pac, MD 05/07/24 2047  "
